# Patient Record
Sex: Male | Born: 1940 | Race: White | Hispanic: No | Marital: Married | Smoking: Never smoker
Health system: Southern US, Community
[De-identification: ages and names within clinical notes are randomized; demographics above are authoritative.]

## PROBLEM LIST (undated history)

## (undated) DIAGNOSIS — D649 Anemia, unspecified: Secondary | ICD-10-CM

## (undated) DIAGNOSIS — E119 Type 2 diabetes mellitus without complications: Secondary | ICD-10-CM

## (undated) DIAGNOSIS — I2510 Atherosclerotic heart disease of native coronary artery without angina pectoris: Secondary | ICD-10-CM

## (undated) DIAGNOSIS — Z95810 Presence of automatic (implantable) cardiac defibrillator: Secondary | ICD-10-CM

## (undated) DIAGNOSIS — Z95811 Presence of heart assist device: Secondary | ICD-10-CM

## (undated) DIAGNOSIS — I480 Paroxysmal atrial fibrillation: Secondary | ICD-10-CM

## (undated) DIAGNOSIS — I472 Ventricular tachycardia, unspecified: Secondary | ICD-10-CM

## (undated) DIAGNOSIS — A419 Sepsis, unspecified organism: Secondary | ICD-10-CM

## (undated) DIAGNOSIS — Z9889 Other specified postprocedural states: Secondary | ICD-10-CM

## (undated) DIAGNOSIS — I5022 Chronic systolic (congestive) heart failure: Secondary | ICD-10-CM

## (undated) DIAGNOSIS — N184 Chronic kidney disease, stage 4 (severe): Secondary | ICD-10-CM

## (undated) DIAGNOSIS — Z8679 Personal history of other diseases of the circulatory system: Secondary | ICD-10-CM

## (undated) DIAGNOSIS — Z951 Presence of aortocoronary bypass graft: Secondary | ICD-10-CM

## (undated) DIAGNOSIS — I219 Acute myocardial infarction, unspecified: Secondary | ICD-10-CM

## (undated) HISTORY — DX: Acute myocardial infarction, unspecified: I21.9

## (undated) HISTORY — DX: Personal history of other diseases of the circulatory system: Z86.79

## (undated) HISTORY — DX: Presence of automatic (implantable) cardiac defibrillator: Z95.810

## (undated) HISTORY — DX: Other specified postprocedural states: Z98.890

## (undated) HISTORY — DX: Anemia, unspecified: D64.9

## (undated) HISTORY — DX: Sepsis, unspecified organism: A41.9

## (undated) HISTORY — DX: Presence of aortocoronary bypass graft: Z95.1

---

## 1980-04-09 DIAGNOSIS — I219 Acute myocardial infarction, unspecified: Secondary | ICD-10-CM

## 1980-04-09 HISTORY — DX: Acute myocardial infarction, unspecified: I21.9

## 1999-07-11 DIAGNOSIS — Z951 Presence of aortocoronary bypass graft: Secondary | ICD-10-CM

## 1999-07-11 DIAGNOSIS — Z95810 Presence of automatic (implantable) cardiac defibrillator: Secondary | ICD-10-CM

## 1999-07-11 HISTORY — DX: Presence of automatic (implantable) cardiac defibrillator: Z95.810

## 1999-07-11 HISTORY — DX: Presence of aortocoronary bypass graft: Z95.1

## 2002-05-11 DIAGNOSIS — Z9889 Other specified postprocedural states: Secondary | ICD-10-CM

## 2002-05-11 DIAGNOSIS — Z8679 Personal history of other diseases of the circulatory system: Secondary | ICD-10-CM

## 2002-05-11 HISTORY — DX: Personal history of other diseases of the circulatory system: Z98.890

## 2002-05-11 HISTORY — DX: Personal history of other diseases of the circulatory system: Z86.79

## 2003-12-11 DIAGNOSIS — Z9289 Personal history of other medical treatment: Secondary | ICD-10-CM

## 2003-12-11 DIAGNOSIS — Z9889 Other specified postprocedural states: Secondary | ICD-10-CM

## 2003-12-11 HISTORY — DX: Other specified postprocedural states: Z98.890

## 2003-12-11 HISTORY — DX: Personal history of other medical treatment: Z92.89

## 2007-05-11 DIAGNOSIS — A419 Sepsis, unspecified organism: Secondary | ICD-10-CM

## 2007-05-11 DIAGNOSIS — B999 Unspecified infectious disease: Secondary | ICD-10-CM

## 2007-05-11 HISTORY — DX: Sepsis, unspecified organism: A41.9

## 2007-05-11 HISTORY — DX: Unspecified infectious disease: B99.9

## 2007-06-10 DIAGNOSIS — D649 Anemia, unspecified: Secondary | ICD-10-CM

## 2007-06-10 HISTORY — DX: Anemia, unspecified: D64.9

## 2010-05-29 HISTORY — PX: COLONOSCOPY: SHX174

## 2010-07-18 ENCOUNTER — Emergency Department (HOSPITAL_COMMUNITY)
Admission: EM | Admit: 2010-07-18 | Discharge: 2010-07-18 | Disposition: A | Discharge status: Home / Self Care | Payer: MEDICARE | Attending: Emergency Medicine | Admitting: Emergency Medicine

## 2010-07-18 DIAGNOSIS — Z79899 Other long term (current) drug therapy: Secondary | ICD-10-CM | POA: Insufficient documentation

## 2010-07-18 DIAGNOSIS — R197 Diarrhea, unspecified: Secondary | ICD-10-CM | POA: Insufficient documentation

## 2010-07-18 DIAGNOSIS — I509 Heart failure, unspecified: Secondary | ICD-10-CM | POA: Insufficient documentation

## 2010-07-18 DIAGNOSIS — I252 Old myocardial infarction: Secondary | ICD-10-CM | POA: Insufficient documentation

## 2010-07-18 DIAGNOSIS — Z950 Presence of cardiac pacemaker: Secondary | ICD-10-CM | POA: Insufficient documentation

## 2010-07-18 DIAGNOSIS — K625 Hemorrhage of anus and rectum: Secondary | ICD-10-CM | POA: Insufficient documentation

## 2010-07-18 DIAGNOSIS — Z7901 Long term (current) use of anticoagulants: Secondary | ICD-10-CM | POA: Insufficient documentation

## 2010-07-18 LAB — CBC
Hemoglobin: 14.3 g/dL (ref 13.0–17.0)
MCH: 30.2 pg (ref 26.0–34.0)
MCHC: 33.6 g/dL (ref 30.0–36.0)
MCV: 89.7 fL (ref 78.0–100.0)

## 2010-07-18 LAB — BASIC METABOLIC PANEL
Calcium: 8.6 mg/dL (ref 8.4–10.5)
Creatinine, Ser: 2.21 mg/dL — ABNORMAL HIGH (ref 0.4–1.5)
GFR calc non Af Amer: 30 mL/min — ABNORMAL LOW (ref >60)
Glucose, Bld: 121 mg/dL — ABNORMAL HIGH (ref 70–99)
Sodium: 138 mEq/L (ref 135–145)

## 2010-07-18 LAB — DIFFERENTIAL
Basophils Relative: 0 % (ref 0–1)
Eosinophils Absolute: 0.2 10*3/uL (ref 0.0–0.7)
Lymphs Abs: 0.9 10*3/uL (ref 0.7–4.0)
Monocytes Absolute: 0.7 10*3/uL (ref 0.1–1.0)
Monocytes Relative: 9 % (ref 3–12)

## 2010-07-18 LAB — PROTIME-INR: INR: 2.62 — ABNORMAL HIGH (ref 0.00–1.49)

## 2010-07-20 ENCOUNTER — Emergency Department (HOSPITAL_COMMUNITY)
Admission: EM | Admit: 2010-07-20 | Discharge: 2010-07-20 | Disposition: A | Discharge status: Home / Self Care | Payer: MEDICARE | Attending: Emergency Medicine | Admitting: Emergency Medicine

## 2010-07-20 DIAGNOSIS — R42 Dizziness and giddiness: Secondary | ICD-10-CM | POA: Insufficient documentation

## 2010-07-20 DIAGNOSIS — Z79899 Other long term (current) drug therapy: Secondary | ICD-10-CM | POA: Insufficient documentation

## 2010-07-20 DIAGNOSIS — Z950 Presence of cardiac pacemaker: Secondary | ICD-10-CM | POA: Insufficient documentation

## 2010-07-20 DIAGNOSIS — Z7982 Long term (current) use of aspirin: Secondary | ICD-10-CM | POA: Insufficient documentation

## 2010-07-20 DIAGNOSIS — K625 Hemorrhage of anus and rectum: Secondary | ICD-10-CM | POA: Insufficient documentation

## 2010-07-20 DIAGNOSIS — I252 Old myocardial infarction: Secondary | ICD-10-CM | POA: Insufficient documentation

## 2010-07-20 DIAGNOSIS — Z794 Long term (current) use of insulin: Secondary | ICD-10-CM | POA: Insufficient documentation

## 2010-07-20 DIAGNOSIS — T45515A Adverse effect of anticoagulants, initial encounter: Secondary | ICD-10-CM | POA: Insufficient documentation

## 2010-07-20 DIAGNOSIS — I509 Heart failure, unspecified: Secondary | ICD-10-CM | POA: Insufficient documentation

## 2010-07-20 DIAGNOSIS — D649 Anemia, unspecified: Secondary | ICD-10-CM | POA: Insufficient documentation

## 2010-07-20 DIAGNOSIS — Z7901 Long term (current) use of anticoagulants: Secondary | ICD-10-CM | POA: Insufficient documentation

## 2010-07-20 LAB — COMPREHENSIVE METABOLIC PANEL
AST: 18 U/L (ref 0–37)
BUN: 60 mg/dL — ABNORMAL HIGH (ref 6–23)
CO2: 26 mEq/L (ref 19–32)
Calcium: 7.8 mg/dL — ABNORMAL LOW (ref 8.4–10.5)
Chloride: 100 mEq/L (ref 96–112)
Creatinine, Ser: 2.23 mg/dL — ABNORMAL HIGH (ref 0.4–1.5)
GFR calc Af Amer: 35 mL/min — ABNORMAL LOW (ref >60)
GFR calc non Af Amer: 29 mL/min — ABNORMAL LOW (ref >60)
Glucose, Bld: 154 mg/dL — ABNORMAL HIGH (ref 70–99)
Total Bilirubin: 0.5 mg/dL (ref 0.3–1.2)

## 2010-07-20 LAB — CBC
HCT: 29.2 % — ABNORMAL LOW (ref 39.0–52.0)
Hemoglobin: 9.8 g/dL — ABNORMAL LOW (ref 13.0–17.0)
MCH: 29.6 pg (ref 26.0–34.0)
MCHC: 33.6 g/dL (ref 30.0–36.0)
MCV: 88.2 fL (ref 78.0–100.0)
RDW: 15.4 % (ref 11.5–15.5)

## 2010-07-20 LAB — DIFFERENTIAL
Basophils Absolute: 0 10*3/uL (ref 0.0–0.1)
Eosinophils Relative: 1 % (ref 0–5)
Lymphocytes Relative: 7 % — ABNORMAL LOW (ref 12–46)
Lymphs Abs: 0.6 10*3/uL — ABNORMAL LOW (ref 0.7–4.0)
Monocytes Absolute: 0.5 10*3/uL (ref 0.1–1.0)
Monocytes Relative: 7 % (ref 3–12)
Neutro Abs: 7 10*3/uL (ref 1.7–7.7)

## 2010-07-20 LAB — ABO/RH: ABO/RH(D): O NEG

## 2010-07-21 LAB — CROSSMATCH
ABO/RH(D): O NEG
Antibody Screen: NEGATIVE
Unit division: 0

## 2012-08-23 HISTORY — PX: DOBUTAMINE STRESS ECHO: SHX5426

## 2013-06-20 HISTORY — PX: HERNIA MESH REMOVAL: SHX1745

## 2013-09-04 ENCOUNTER — Ambulatory Visit (INDEPENDENT_AMBULATORY_CARE_PROVIDER_SITE_OTHER): Payer: MEDICARE | Admitting: Internal Medicine

## 2013-09-04 ENCOUNTER — Telehealth: Payer: Self-pay | Admitting: Family Medicine

## 2013-09-04 VITALS — BP 128/80 | HR 90 | Temp 98.3°F | Resp 18 | Ht 66.0 in | Wt 218.8 lb

## 2013-09-04 DIAGNOSIS — L989 Disorder of the skin and subcutaneous tissue, unspecified: Secondary | ICD-10-CM

## 2013-09-04 MED ORDER — MUPIROCIN 2 % EX OINT: 1.0000 "application " | TOPICAL_OINTMENT | Freq: Three times a day (TID) | CUTANEOUS | Status: DC

## 2013-09-04 MED ORDER — DOXYCYCLINE HYCLATE 100 MG PO TABS: 100.0000 mg | ORAL_TABLET | Freq: Two times a day (BID) | ORAL | Status: DC

## 2013-09-04 NOTE — Telephone Encounter (Signed)
Pharm called w/pt at pharm to ask about plan. I explained Dr Ernestene MentionGuest's instr's to take Doxy but call cardiologist to adjust warfarin dose. Pharm advised pt while I was on the phone and he is in agreement with the plan.

## 2013-09-04 NOTE — Patient Instructions (Signed)
Basal Cell Carcinoma Basal cell carcinoma is the most common form of skin cancer. It begins in the basal cells, which are at the bottom of the outer skin layer (epidermis). CAUSES  Sun exposure is the most common cause of basal cell carcinoma. Basal cell carcinoma occurs most often on parts of the body that are frequently exposed to the sun, including the:  Scalp.  Ears.  Neck.  Face.  Arms.  Backs of the hands.  Legs. However, basal cell carcinoma can occur anywhere on the body. Rarely, tumors develop on areas not exposed to the sun. Other causes of basal cell carcinoma can include:  Exposure to arsenic.  Exposure to radiation.  Certain genetic syndromes, such as xeroderma pigmentosum. RISK FACTORS People at highest risk for basal cell carcinoma include those with:  Fair skin.  Blonde or red hair.  Blue, green, or gray eyes.  Childhood freckling. Factors that increase your risk for basal cell carcinoma include:  Sun exposure over long periods of time. Childhood sun exposure appears to be a more significant factor than sun exposure as an adult.  Repeated sunburns.  Use of tanning beds.  Having a weakened immune system. SYMPTOMS Five signs of basal cell carcinoma are:  An open sore that bleeds, oozes, or crusts. The sore may remain open for 3 or more weeks. This can be an early sign of basal cell carcinoma. Basal cell carcinoma can mimic a pimple that will not heal.  A reddish or irritated area which may crust, itch, or cause discomfort. This may occur on areas expose d to the sun. These patches might be easier felt than seen.  A shiny, pearly, or translucent bump that is pink, red, or white. The bump may also be tan, black, or brown, especially in dark haired people. These bumps can be confused with moles.  A pink growth with a slightly elevated, rolled border, and a crusted indentation in the center. As the growth slowly enlarges, tiny blood vessels may develop  on the surface.  A scar-like white, yellow, or waxy area that looks like shiny, stretched skin. It often has irregular borders. This may be a sign of more aggressive basal cell carcinoma. DIAGNOSIS  Your caregiver may be able to tell what is wrong by doing a physical exam. Often, a tissue sample (biopsy) is also taken. The tissue is examined under a microscope.  TREATMENT  The treatment for basal cell carcinoma depends on the type, size, location, and number of tumors. Possible treatments include:   Mohs surgery. This is a procedure done by a skin doctor (dermatologist or Mohs surgeon) in his or her office. The cancerous cells are removed layer by layer. This treatment has a high cure rate.  Surgical removal of the tumor.  Freezing the tumor with liquid nitrogen (cryosurgery).  Plastic surgery to remove the tumor, in the case of large tumors.  Radiation. This may be used for tumors on the face.  Photodynamic therapy. A chemical cream is applied to the skin and light exposure is used to activate the chemical.  Chemical treatments, such as imiquimod cream and interferon injections. This may be used to remove superficial tumors with minimal scarring.  Electrodesiccation and curettage. This involves alternately scraping and burning the tumor, using an electric current to control bleeding. Basal cell carcinoma can almost always be cured. It rarely spreads to other areas of the body (metastasizes). Basal cell carcinoma may come back at the same location (recur), but it can be treated   again if this occurs. PREVENTION  Avoid the sun between 10:00 a.m. and 4:00 pm when it is the strongest.  Use a sunscreen or sunblock with a sun protection factor of 30 or greater.  Apply sunscreen at least 30 minutes before exposure to the sun.  Reapply sunscreen every 2 to 4 hours while you are outside, after swimming, and after excessive sweating.  Always wear protective hats, clothing, and sunglasses with  ultraviolet protection.  Avoid tanning beds. HOME CARE INSTRUCTIONS   Avoid unprotected sun exposure.  Follow your caregiver's instructions for self-exams. Look for new spots or changes in your skin.  Keep all follow-up appointments as directed by your caregiver. SEEK MEDICAL CARE IF:   You notice any new spots or changes in your skin.  You have had a basal cell carcinoma tumor removed and you notice a new growth in the same location. Document Released: 08/02/2002 Document Revised: 07/28/2011 Document Reviewed: 10/20/2010 ExitCare Patient Information 2015 [employer]. This information is not intended to replace advice given to you by your health care provider. Make sure you discuss any questions you have with your health care provider.  

## 2013-09-04 NOTE — Progress Notes (Signed)
   Subjective:    Patient ID: William Huerta, male    DOB: 1940-12-28, 73 y.o.   MRN: [id number]  HPI William AlcideRobert Lorincz is an 73 year old male that is being seen for boil on his left forearm.  Pt indicates boil has been on his forearm for about 4 weeks. OTC cream has been applied with little help. Pt states boil is sore and it is not itching or burning. There has been no discharge and he denies fever, chills, N/V, diarrhea/constipation and no injury that may have caused boil. Not painful.   Review of Systems     Objective:   Physical Exam  Constitutional: He is oriented to person, place, and time. He appears well-nourished. No distress.  HENT:  Head: Normocephalic.  Eyes: EOM are normal.  Neck: Normal range of motion.  Pulmonary/Chest: Effort normal.  Musculoskeletal: Normal range of motion.  Neurological: He is alert and oriented to person, place, and time. He exhibits normal muscle tone. Coordination normal.  Skin: Lesion and rash noted.     Ulcerated 1-2cm lesion left forearm.  Psychiatric: He has a normal mood and affect.          Assessment & Plan:  Probable Basal cell or squamous cell lesion See dermatology or surgeon to remove and analyze for cancer Doxycycline/mupirocin

## 2013-09-04 NOTE — Telephone Encounter (Signed)
LMOM for pt to call back He needs to call his cardiologist to have his coumadin readjusted while he is taking the doxy only.

## 2013-09-05 ENCOUNTER — Emergency Department (HOSPITAL_COMMUNITY)
Admission: EM | Admit: 2013-09-05 | Discharge: 2013-09-05 | Disposition: A | Discharge status: Home / Self Care | Payer: MEDICARE | Attending: Emergency Medicine | Admitting: Emergency Medicine

## 2013-09-05 ENCOUNTER — Encounter (HOSPITAL_COMMUNITY): Payer: Self-pay | Admitting: Emergency Medicine

## 2013-09-05 ENCOUNTER — Emergency Department (HOSPITAL_COMMUNITY): Payer: MEDICARE

## 2013-09-05 DIAGNOSIS — Z8619 Personal history of other infectious and parasitic diseases: Secondary | ICD-10-CM | POA: Diagnosis not present

## 2013-09-05 DIAGNOSIS — Z7901 Long term (current) use of anticoagulants: Secondary | ICD-10-CM | POA: Diagnosis not present

## 2013-09-05 DIAGNOSIS — Z95811 Presence of heart assist device: Secondary | ICD-10-CM

## 2013-09-05 DIAGNOSIS — D649 Anemia, unspecified: Secondary | ICD-10-CM | POA: Insufficient documentation

## 2013-09-05 DIAGNOSIS — Z9889 Other specified postprocedural states: Secondary | ICD-10-CM | POA: Diagnosis not present

## 2013-09-05 DIAGNOSIS — I509 Heart failure, unspecified: Secondary | ICD-10-CM | POA: Insufficient documentation

## 2013-09-05 DIAGNOSIS — R059 Cough, unspecified: Secondary | ICD-10-CM | POA: Insufficient documentation

## 2013-09-05 DIAGNOSIS — Z79899 Other long term (current) drug therapy: Secondary | ICD-10-CM | POA: Insufficient documentation

## 2013-09-05 DIAGNOSIS — I252 Old myocardial infarction: Secondary | ICD-10-CM | POA: Diagnosis not present

## 2013-09-05 DIAGNOSIS — Z794 Long term (current) use of insulin: Secondary | ICD-10-CM | POA: Diagnosis not present

## 2013-09-05 DIAGNOSIS — J159 Unspecified bacterial pneumonia: Secondary | ICD-10-CM | POA: Insufficient documentation

## 2013-09-05 DIAGNOSIS — R05 Cough: Secondary | ICD-10-CM | POA: Insufficient documentation

## 2013-09-05 DIAGNOSIS — J189 Pneumonia, unspecified organism: Secondary | ICD-10-CM

## 2013-09-05 DIAGNOSIS — R042 Hemoptysis: Secondary | ICD-10-CM | POA: Diagnosis not present

## 2013-09-05 DIAGNOSIS — Z95810 Presence of automatic (implantable) cardiac defibrillator: Secondary | ICD-10-CM | POA: Insufficient documentation

## 2013-09-05 DIAGNOSIS — I5022 Chronic systolic (congestive) heart failure: Secondary | ICD-10-CM

## 2013-09-05 LAB — BASIC METABOLIC PANEL
Anion gap: 13 (ref 5–15)
BUN: 26 mg/dL — ABNORMAL HIGH (ref 6–23)
CO2: 25 mEq/L (ref 19–32)
Calcium: 9.2 mg/dL (ref 8.4–10.5)
Chloride: 103 mEq/L (ref 96–112)
Creatinine, Ser: 1.66 mg/dL — ABNORMAL HIGH (ref 0.50–1.35)
GFR calc Af Amer: 45 mL/min — ABNORMAL LOW (ref >90)
GFR calc non Af Amer: 39 mL/min — ABNORMAL LOW (ref >90)
GLUCOSE: 123 mg/dL — AB (ref 70–99)
POTASSIUM: 4.7 meq/L (ref 3.7–5.3)
Sodium: 141 mEq/L (ref 137–147)

## 2013-09-05 LAB — I-STAT TROPONIN, ED: Troponin i, poc: 0 ng/mL (ref 0.00–0.08)

## 2013-09-05 LAB — PROTIME-INR
INR: 1.65 — ABNORMAL HIGH (ref 0.00–1.49)
Prothrombin Time: 19.5 seconds — ABNORMAL HIGH (ref 11.6–15.2)

## 2013-09-05 LAB — CBC
HCT: 41.7 % (ref 39.0–52.0)
HEMOGLOBIN: 13.1 g/dL (ref 13.0–17.0)
MCH: 29.1 pg (ref 26.0–34.0)
MCHC: 31.4 g/dL (ref 30.0–36.0)
MCV: 92.7 fL (ref 78.0–100.0)
Platelets: 129 10*3/uL — ABNORMAL LOW (ref 150–400)
RBC: 4.5 MIL/uL (ref 4.22–5.81)
RDW: 16 % — ABNORMAL HIGH (ref 11.5–15.5)
WBC: 11.2 10*3/uL — ABNORMAL HIGH (ref 4.0–10.5)

## 2013-09-05 LAB — LACTATE DEHYDROGENASE: LDH: 349 U/L — ABNORMAL HIGH (ref 94–250)

## 2013-09-05 LAB — PRO B NATRIURETIC PEPTIDE: PRO B NATRI PEPTIDE: 978.6 pg/mL — AB (ref 0–125)

## 2013-09-05 MED ORDER — DEXTROSE 5 % IV SOLN
500.0000 mg | Freq: Once | INTRAVENOUS | Status: DC
  Filled 2013-09-05: qty 500

## 2013-09-05 MED ORDER — VANCOMYCIN HCL 10 G IV SOLR
2000.0000 mg | Freq: Once | INTRAVENOUS | Status: DC
  Filled 2013-09-05: qty 2000

## 2013-09-05 MED ORDER — MOXIFLOXACIN HCL 400 MG PO TABS: 400.0000 mg | ORAL_TABLET | Freq: Every day | ORAL | Status: DC

## 2013-09-05 NOTE — Consult Note (Signed)
Primary Physician: Primary Cardiologist:  Dr. Horatio Pelolumbo 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 Presents to ER with productive cough and small volume hemoptysis.  Has been doing very well. 2 months ago had gallbladder out. Over past 2 days has had cough productive white/yellow sputum. + mild wheezing. No fevers or chills. Today coughed up small amount of bloody sputum x 1 prior to arrival. In ER sats 97% RA. WBC 11.2. Afebrile. CXR with small RUL PNA. INR 1.65. Denies orthopnea, PND. No problems with driveline site. No other bleeding.    Pt has HM II LVAD with EPC connected to batteries; back up equipment present. Left abdominal driveline with dressing dry with attachment device intact. Pt denies any heart failure symptoms; denies any blood in urine or stool or tea colored urine. Denies any fevers, chills, or drainage for drive line. Reviewed daily log with stable weights 205 - 207 lbs; no fevers, and VAD parameters stable. Pt denies any VAD alarms or equipment issues. MAP BP 88. VAD parameters: speed 8800, Flow 4.6, PI 5.5, Power 5.7 with 0 - 4 PI events and no alarms noted in history. Fixed speed 8800, low speed limit 8000.   Contacted VAD coordinator Minus Liberty(Rosie) at [facility] with patient status. Last clinic note from June 2015 faxed to our ED; contact info for Dr. Orpah MelterGaran and Dr. Horatio Pelolumbo obtained and all given to ED staff.  MAP and VAD parameters remain stable:  MAP 86  Speed: 8800  Flow: 5.6  PI: 5.2  Power: 5.5  [facility] office: [phone]  Fax: [phone]   Review of Systems: [y] = yes, [ ]  = no   General: Weight gain [ ] ; Weight loss [ ] ; Anorexia [ ] ; Fatigue [ ] ; Fever [ ] ; Chills [ ] ; Weakness [ ]   Cardiac: Chest pain/pressure [ ] ; Resting SOB [ ] ; Exertional SOB [ ] ; Orthopnea [ ] ; Pedal Edema [ ] ;  Palpitations [ ] ; Syncope [ ] ; Presyncope [ ] ; Paroxysmal nocturnal dyspnea[ ]   Pulmonary: Cough Cove.Etienne[y ]; [facility][y ]; Hemoptysis[y ]; Sputum Cove.Etienne[y ]; Snoring [ ]   GI: Vomiting[ ] ; Dysphagia[ ] ; Melena[ ] ; Hematochezia [ ] ; Heartburn[ ] ; Abdominal pain [ ] ; Constipation [ ] ; Diarrhea [ ] ; BRBPR [ ]   GU: Hematuria[ ] ; Dysuria [ ] ; Nocturia[ ]   Vascular: Pain in legs with walking [ ] ; Pain in feet with lying flat [ ] ; Non-healing sores [ ] ; Stroke [ ] ; TIA [ ] ; Slurred speech [ ] ;  Neuro: Headaches[ ] ; Vertigo[ ] ; Seizures[ ] ; Paresthesias[ ] ;Blurred vision [ ] ; Diplopia [ ] ; Vision changes [ ]   Ortho/Skin: Arthritis [ y]; Joint pain [ y]; Muscle pain [ ] ; Joint swelling [ ] ; Back Pain [ ] ; Rash [ ]   Psych: Depression[ ] ; Anxiety[ ]   Heme: Bleeding problems [ ] ; Clotting disorders [ ] ; Anemia [ ]   Endocrine: Diabetes Cove.Etienne[y ]; Thyroid dysfunction[ ]   Home Medications Prior to Admission medications   Medication Sig Start Date End Date Taking? Authorizing Provider  acetaminophen (TYLENOL) 500 MG tablet Take 500 mg by mouth every 6 (six) hours as needed for moderate pain.   Yes Historical Provider, MD  amiodarone (PACERONE) 200 MG tablet Take 200 mg by mouth daily.   Yes Historical Provider, MD  carvedilol (COREG) 3.125 MG tablet Take 3.125 mg by mouth 2 (two) times  daily with a meal.   Yes Historical Provider, MD  ferrous sulfate 325 (65 FE) MG tablet Take 325 mg by mouth 2 (two) times daily with a meal.    Yes Historical Provider, MD  furosemide (LASIX) 40 MG tablet Take 40 mg by mouth 2 (two) times daily.   Yes Historical Provider, MD  insulin NPH Human (HUMULIN N,NOVOLIN N) 100 UNIT/ML injection Inject 5 Units into the skin daily before breakfast.    Yes Historical Provider, MD  lisinopril (PRINIVIL,ZESTRIL) 5 MG tablet Take 5 mg by mouth every evening.    Yes Historical Provider, MD  Multiple Vitamin (MULTIVITAMIN) capsule Take 1 capsule by mouth daily.   Yes Historical Provider, MD  tamsulosin (FLOMAX)  0.4 MG CAPS capsule Take 0.4 mg by mouth daily after breakfast.    Yes Historical Provider, MD  warfarin (COUMADIN) 5 MG tablet Take 2.5-5 mg by mouth daily. Take 5 mg on Tues/Thurs and 2.5 mg for the rest of the week   Yes Historical Provider, MD  doxycycline (VIBRA-TABS) 100 MG tablet Take 1 tablet (100 mg total) by mouth 2 (two) times daily. 09/04/13   Jonita Albee, MD  moxifloxacin (AVELOX) 400 MG tablet Take 1 tablet (400 mg total) by mouth daily at 8 pm. 09/05/13   Shanna Cisco, MD    Past Medical History: Past Medical History  Diagnosis Date  . Heart attack 04/1980  . History of quadruple bypass 07/1999    Quad Bypass, mitral valve repair  . Presence of combination internal cardiac defibrillator (ICD) and pacemaker 07/1999    Guidant #103003  . S/P ablation of atrial fibrillation 05/2002  . History of cardioversion 12/2003  . Blood infection 05/2007  . Anemia 06/2007  . CHF (congestive heart failure) 05/2003, 06/2003, 11/2007    Past Surgical History: Past Surgical History  Procedure Laterality Date  . Colonoscopy  05/29/2010  . Dobutamine stress echo  08/23/2012  . Hernia mesh removal  06/20/13    Family History: Family History  Problem Relation Age of Onset  . Heart disease Sister   . Diabetes Brother   . Heart disease Brother   . Heart disease Brother     Social History: History   Social History  . Marital Status: Married    Spouse Name: N/A    Number of Children: N/A  . Years of Education: N/A   Social History Main Topics  . Smoking status: Never Smoker   . Smokeless tobacco: None  . Alcohol Use: No  . Drug Use: No  . Sexual Activity: None   Other Topics Concern  . None   Social History Narrative  . None    Allergies:  No Known Allergies  Objective:    Vital Signs:   Temp:  [98.5 F (36.9 C)] 98.5 F (36.9 C) (07/28 1438) Pulse Rate:  [85-91] 91 (07/28 1802) Resp:  [19-20] 19 (07/28 1802) BP: (108)/(74) 108/74 mmHg (07/28 1438) SpO2:   [95 %-100 %] 100 % (07/28 1802) Weight:  [98.884 kg (218 lb)] 98.884 kg (218 lb) (07/28 1802)   Filed Weights   09/05/13 1802  Weight: 98.884 kg (218 lb)    Mean arterial Pressure 86-88 Sat 93-97% RA  Physical Exam: General:  Well appearing. No resp difficulty HEENT: normal Neck: supple. JVP 7 . Carotids 2+ bilat; no bruits. No lymphadenopathy or thryomegaly appreciated. Cor: Mechanical heart sounds with LVAD hum present. Lungs: clear. No wheeze Abdomen: obese soft, nontender, nondistended. No hepatosplenomegaly. No bruits  or masses. Good bowel sounds. Driveline: C/D/I; securement device intact and driveline incorporated Extremities: no cyanosis, clubbing, rash, edema Neuro: alert & orientedx3, cranial nerves grossly intact. moves all 4 extremities w/o difficulty. Affect pleasant   Labs: Basic Metabolic Panel:  Recent Labs Lab 09/05/13 1529  NA 141  K 4.7  CL 103  CO2 25  GLUCOSE 123*  BUN 26*  CREATININE 1.66*  CALCIUM 9.2    Liver Function Tests: No results found for this basename: AST, ALT, ALKPHOS, BILITOT, PROT, ALBUMIN,  in the last 168 hours No results found for this basename: LIPASE, AMYLASE,  in the last 168 hours No results found for this basename: AMMONIA,  in the last 168 hours  CBC:  Recent Labs Lab 09/05/13 1529  WBC 11.2*  HGB 13.1  HCT 41.7  MCV 92.7  PLT 129*    Cardiac Enzymes: No results found for this basename: CKTOTAL, CKMB, CKMBINDEX, TROPONINI,  in the last 168 hours  BNP: BNP (last 3 results)  Recent Labs  09/05/13 1526  PROBNP 978.6*    CBG: No results found for this basename: GLUCAP,  in the last 168 hours  Coagulation Studies:  Recent Labs  09/05/13 1538  LABPROT 19.5*  INR 1.65*    Other results:   Imaging: Dg Chest 2 View  09/05/2013   CLINICAL DATA:  LEFT ventricular assist device for 5 years, recently coughing up phlegm, wheezing, hemoptysis, shortness of breath, history hypertension, diabetes, CHF,  coronary artery disease post CABG  EXAM: CHEST  2 VIEW  COMPARISON:  None  FINDINGS: LEFT ventricular assist device present.  LEFT subclavian AICD leads project at RIGHT atrium, RIGHT ventricle and coronary sinus.  Enlargement of cardiac silhouette post median sternotomy.  Atherosclerotic calcification aorta.  Focal perihilar infiltrate in RIGHT upper lobe question pneumonia.  Remaining lungs grossly clear.  No pleural effusion or pneumothorax.  Bones appear mildly demineralized.  IMPRESSION: Enlargement of cardiac silhouette post median sternotomy, AICD, and LVAD placement.  Suspected RIGHT upper lobe perihilar pneumonia.   Electronically Signed   By: Ulyses Southward M.D.   On: 09/05/2013 17:17         Assessment:  1. RUL PNA 2. Chronic systolic HF s/p VAD 3. CAD  4. Mild hemoptysis   Plan/Discussion:    He has small RUL PNA. No O2 requirement and hemodynamically stable. Will start moxifloxacin 400 mg daily x 7days and d/c home. Will not increase coumadin as INR likely to come up with abx. See in VAD Clinic Friday. i gave him Clinic number to call if problems. D/w Dr. Ellin Goodie by phone who is comfortable with plan.    I reviewed the LVAD parameters from today, and compared the results to the patient's prior recorded data.  No programming changes were made.  The LVAD is functioning within specified parameters.  The patient performs LVAD self-test daily.  LVAD interrogation was negative for any significant power changes, alarms or PI events/speed drops.  LVAD equipment check completed and is in good working order.  Back-up equipment present.   LVAD education done on emergency procedures and precautions and reviewed exit site care.  Length of Stay: 0  Holly Bodily 09/05/2013, 6:57 PM  VAD Team Pager [phone] (7am - 7am) +++VAD ISSUES ONLY+++ Advanced Heart Failure Team Pager [phone] (M-F; 7a - 4p)  Please contact [facility] for night-coverage after hours (4p -7a ) and weekends on  [email or web address] for all non- LVAD Issues

## 2013-09-05 NOTE — ED Notes (Signed)
Patient returned from xray.

## 2013-09-05 NOTE — ED Provider Notes (Signed)
CSN: [id number]     Arrival date & time 09/05/13  1434 History   First MD Initiated Contact with Patient 09/05/13 1528     Chief Complaint  Patient presents with  . Cough  . Wheezing     (Consider location/radiation/quality/duration/timing/severity/associated sxs/prior Treatment) Patient is a 73 y.o. male presenting with cough.  Cough Cough characteristics:  Productive Severity:  Moderate Onset quality:  Gradual Timing:  Constant Progression:  Worsening Chronicity:  New Associated symptoms: no chest pain, no headaches and no shortness of breath      Past Medical History  Diagnosis Date  . Heart attack 04/1980  . History of quadruple bypass 07/1999    Quad Bypass, mitral valve repair  . Presence of combination internal cardiac defibrillator (ICD) and pacemaker 07/1999    Guidant #103003  . S/P ablation of atrial fibrillation 05/2002  . History of cardioversion 12/2003  . Blood infection 05/2007  . Anemia 06/2007  . CHF (congestive heart failure) 05/2003, 06/2003, 11/2007   Past Surgical History  Procedure Laterality Date  . Colonoscopy  05/29/2010  . Dobutamine stress echo  08/23/2012  . Hernia mesh removal  06/20/13   Family History  Problem Relation Age of Onset  . Heart disease Sister   . Diabetes Brother   . Heart disease Brother   . Heart disease Brother    History  Substance Use Topics  . Smoking status: Never Smoker   . Smokeless tobacco: Not on file  . Alcohol Use: No    Review of Systems  Constitutional: Negative for activity change.  HENT: Negative for congestion.   Eyes: Negative for visual disturbance.  Respiratory: Positive for cough. Negative for shortness of breath.        Blood in sputum   Cardiovascular: Negative for chest pain and leg swelling.  Gastrointestinal: Negative for abdominal pain and blood in stool.  Genitourinary: Negative for dysuria and hematuria.  Musculoskeletal: Negative for back pain.  Skin: Negative for color change.   Neurological: Negative for syncope and headaches.  Psychiatric/Behavioral: Negative for agitation.      Allergies  Review of patient's allergies indicates no known allergies.  Home Medications   Prior to Admission medications   Medication Sig Start Date End Date Taking? Authorizing Provider  amiodarone (PACERONE) 200 MG tablet Take 200 mg by mouth daily.    Historical Provider, MD  carvedilol (COREG) 3.125 MG tablet Take 3.125 mg by mouth 2 (two) times daily with a meal.    Historical Provider, MD  doxycycline (VIBRA-TABS) 100 MG tablet Take 1 tablet (100 mg total) by mouth 2 (two) times daily. 09/04/13   Jonita Albeehris W Guest, MD  ferrous sulfate 325 (65 FE) MG tablet Take 325 mg by mouth daily with breakfast.    Historical Provider, MD  furosemide (LASIX) 40 MG tablet Take 40 mg by mouth 2 (two) times daily.    Historical Provider, MD  insulin NPH Human (HUMULIN N,NOVOLIN N) 100 UNIT/ML injection Inject 5 Units into the skin.    Historical Provider, MD  lisinopril (PRINIVIL,ZESTRIL) 5 MG tablet Take 5 mg by mouth daily.    Historical Provider, MD  Multiple Vitamin (MULTIVITAMIN) capsule Take 1 capsule by mouth daily.    Historical Provider, MD  mupirocin ointment (BACTROBAN) 2 % Apply 1 application topically 3 (three) times daily. 09/04/13   Jonita Albeehris W Guest, MD  tamsulosin (FLOMAX) 0.4 MG CAPS capsule Take 0.4 mg by mouth.    Historical Provider, MD  warfarin (COUMADIN) 5 MG  tablet Take 5 mg by mouth daily. Take 5 mg on Tues/Thurs and 2.5 mg for the rest of the week    Historical Provider, MD   BP 108/74  Pulse 85  Temp(Src) 98.5 F (36.9 C) (Oral)  Resp 20  SpO2 95% Physical Exam  Nursing note and vitals reviewed. Constitutional: He is oriented to person, place, and time. He appears well-developed and well-nourished.  HENT:  Head: Normocephalic.  Eyes: Pupils are equal, round, and reactive to light.  Neck: Neck supple.  Cardiovascular:  audible hum of LVAD  Distal extremities appear  well perfused  Pulmonary/Chest: Effort normal. No respiratory distress.  Abdominal: Soft. He exhibits no distension. There is no tenderness.  Musculoskeletal: He exhibits no edema.  Neurological: He is alert and oriented to person, place, and time.  Skin: Skin is warm.  Psychiatric: He has a normal mood and affect.    ED Course  Procedures (including critical care time) Labs Review Labs Reviewed  PRO B NATRIURETIC PEPTIDE - Abnormal; Notable for the following:    Pro B Natriuretic peptide (BNP) 978.6 (*)    All other components within normal limits  BASIC METABOLIC PANEL - Abnormal; Notable for the following:    Glucose, Bld 123 (*)    BUN 26 (*)    Creatinine, Ser 1.66 (*)    GFR calc non Af Amer 39 (*)    GFR calc Af Amer 45 (*)    All other components within normal limits  CBC - Abnormal; Notable for the following:    WBC 11.2 (*)    RDW 16.0 (*)    Platelets 129 (*)    All other components within normal limits  LACTATE DEHYDROGENASE - Abnormal; Notable for the following:    LDH 349 (*)    All other components within normal limits  PROTIME-INR  I-STAT TROPOININ, ED    Imaging Review Dg Chest 2 View  09/05/2013   CLINICAL DATA:  LEFT ventricular assist device for 5 years, recently coughing up phlegm, wheezing, hemoptysis, shortness of breath, history hypertension, diabetes, CHF, coronary artery disease post CABG  EXAM: CHEST  2 VIEW  COMPARISON:  None  FINDINGS: LEFT ventricular assist device present.  LEFT subclavian AICD leads project at RIGHT atrium, RIGHT ventricle and coronary sinus.  Enlargement of cardiac silhouette post median sternotomy.  Atherosclerotic calcification aorta.  Focal perihilar infiltrate in RIGHT upper lobe question pneumonia.  Remaining lungs grossly clear.  No pleural effusion or pneumothorax.  Bones appear mildly demineralized.  IMPRESSION: Enlargement of cardiac silhouette post median sternotomy, AICD, and LVAD placement.  Suspected RIGHT upper lobe  perihilar pneumonia.   Electronically Signed   By: Ulyses Southward M.D.   On: 09/05/2013 17:17     EKG Interpretation None      MDM   Final diagnoses:  CAP (community acquired pneumonia)   73 year old male with end-stage CHF with LVAD place presents with 3 days of cough. Patient states that he started having blood in his sputum prompting him to present to the emergency department. Patient denies fevers chills shortness of breath or chest pain. Patient also states that he thought he heard a usual sound from his LVAD machine. Patient is well-appearing on exam and sitting comfortably. Chest x-ray reveals pneumonia. Patient's cardiologist any work was contacted by our LVAD specialist. It was determined that the patient would be appropriate for discharge home with oral antibiotics for her pneumonia. Patient agrees to this discharge plan return precautions were given.  Clement Sayres, MD 09/06/13 2124

## 2013-09-05 NOTE — ED Notes (Signed)
Patient to xray.

## 2013-09-05 NOTE — Discharge Instructions (Signed)

## 2013-09-05 NOTE — Progress Notes (Signed)
VAD coordinator paged to ED for arrival of HM II LVAD patient. Pt implanted in [place] and has second home here in [place].  Pt awake, alert; presents to ED with c/o dizziness, lightheadedness, productive cough of white/yellow sputum. Coughed bloody sputum x 1 prior to arrival.  Also c/o "wheezing" in chest. Pt has HM II LVAD with EPC connected to batteries; back up equipment present. Left abdominal driveline with dressing dry with attachment device intact. Pt denies any heart failure symptoms; denies any blood in urine or stool or tea colored urine. Denies any fevers, chills, or drainage for drive line.  Reviewed daily log with stable weights 205 - 207 lbs; no fevers, and VAD parameters stable. Pt denies any VAD alarms or equipment issues.  MAP BP 88. VAD parameters: speed 8800, Flow 4.6, PI 5.5, Power 5.7 with 0 - 4 PI events and no alarms noted in history. Fixed speed 8800, low speed limit 8000.  Contacted VAD coordinator Minus Liberty(Rosie) at [facility] with patient status.  Last clinic note from June 2015 faxed to our ED; contact info for Dr. Orpah MelterGaran and Dr. Horatio Pelolumbo obtained and all given to ED staff.  Dr. Gala RomneyBensimhon notified of patient arrival to our ED.   MAP and VAD parameters remain stable: MAP 86 Speed:  8800 Flow:  5.6 PI:  5.2 Power:  5.5  [facility] V[facility] office:  [phone] Fax:  [phone]

## 2013-09-05 NOTE — ED Notes (Signed)
Kirt BoysMolly, RN, VAD coordinator at bedside with patient. Patient is on VAD monitor applied by [facility].

## 2013-09-05 NOTE — ED Notes (Addendum)
Per pt sts productive cough and wheezing that started today. Denies chest pain. Pt is an LVAD pt from [place].  .Marland Kitchen

## 2013-09-05 NOTE — ED Notes (Signed)
Dr. Lorenda Cahillan Bensimone at bedside discharging patient home.

## 2013-09-07 ENCOUNTER — Other Ambulatory Visit: Payer: Self-pay | Admitting: Internal Medicine

## 2013-09-07 NOTE — ED Provider Notes (Signed)
Medical screening examination/treatment/procedure(s) were conducted as a shared visit with non-physician practitioner(s) and myself.  I personally evaluated the patient during the encounter.   EKG Interpretation   Date/Time:  Tuesday September 05 2013 15:08:09 EDT Ventricular Rate:  90 PR Interval:    QRS Duration: 144 QT Interval:  386 QTC Calculation: 472 R Axis:   -124 Text Interpretation:  Accelerated junctional rhythm Nonspecific  intraventricular conduction delay Anterolateral infarct, age indeterminate  Artifact in lead(s) I II III aVR aVL aVF V1 V2 V3 V4 V5 V6 ED PHYSICIAN  INTERPRETATION AVAILABLE IN [facility] Confirmed by TEST, Record  ([id number]) on 09/07/2013 7:46:19 AM      Pt presents w/ productive cough, one episode of blood streaked sputum. On PE he is nontoxic appearing. LVAD supportive team present shortly after his arrival. CXR w/ developing pna, Dr. Seward SpeckBenisomone has seen pt and also has spoken w/ his home cardiologist. They agree upon outpt treatment of pna. Return precautions given for new or worsening symptoms including worsening cough, SOB, feve,r CP.    Shanna CiscoMegan E Becca Bayne, MD 09/07/13 [phone]

## 2013-09-08 ENCOUNTER — Ambulatory Visit (HOSPITAL_COMMUNITY)
Admission: RE | Admit: 2013-09-08 | Discharge: 2013-09-08 | Disposition: A | Discharge status: Home / Self Care | Payer: MEDICARE | Source: Ambulatory Visit | Attending: Internal Medicine | Admitting: Internal Medicine

## 2013-09-08 ENCOUNTER — Other Ambulatory Visit (HOSPITAL_COMMUNITY): Payer: Self-pay | Admitting: *Deleted

## 2013-09-08 DIAGNOSIS — I5022 Chronic systolic (congestive) heart failure: Secondary | ICD-10-CM

## 2013-09-08 DIAGNOSIS — Z95811 Presence of heart assist device: Secondary | ICD-10-CM | POA: Diagnosis present

## 2013-09-08 DIAGNOSIS — Z7901 Long term (current) use of anticoagulants: Secondary | ICD-10-CM | POA: Diagnosis not present

## 2013-09-08 LAB — BASIC METABOLIC PANEL
Anion gap: 13 (ref 5–15)
BUN: 27 mg/dL — ABNORMAL HIGH (ref 6–23)
CO2: 24 mEq/L (ref 19–32)
Calcium: 8.5 mg/dL (ref 8.4–10.5)
Chloride: 105 mEq/L (ref 96–112)
Creatinine, Ser: 1.87 mg/dL — ABNORMAL HIGH (ref 0.50–1.35)
GFR calc Af Amer: 39 mL/min — ABNORMAL LOW (ref >90)
GFR calc non Af Amer: 34 mL/min — ABNORMAL LOW (ref >90)
GLUCOSE: 135 mg/dL — AB (ref 70–99)
POTASSIUM: 4.4 meq/L (ref 3.7–5.3)
Sodium: 142 mEq/L (ref 137–147)

## 2013-09-08 LAB — CBC
HCT: 42.2 % (ref 39.0–52.0)
HEMOGLOBIN: 13.5 g/dL (ref 13.0–17.0)
MCH: 29.5 pg (ref 26.0–34.0)
MCHC: 32 g/dL (ref 30.0–36.0)
MCV: 92.1 fL (ref 78.0–100.0)
Platelets: 143 10*3/uL — ABNORMAL LOW (ref 150–400)
RBC: 4.58 MIL/uL (ref 4.22–5.81)
RDW: 15.9 % — ABNORMAL HIGH (ref 11.5–15.5)
WBC: 8.2 10*3/uL (ref 4.0–10.5)

## 2013-09-08 LAB — LACTATE DEHYDROGENASE: LDH: 333 U/L — AB (ref 94–250)

## 2013-09-08 LAB — PROTIME-INR
INR: 2.12 — AB (ref 0.00–1.49)
PROTHROMBIN TIME: 23.7 s — AB (ref 11.6–15.2)

## 2013-09-08 NOTE — Progress Notes (Signed)
Pt presents for f/u from recent ED visit on 09/05/13.  Pt was started on Avelox for RUL pneumonia. ED visit note along with labs have been faxed to [facility]. Pt asked that we include Shanna CiscoSheri Lee, NP at his [facility]. Her contact info:  [phone]  Vital signs: HR:  103 MAP BP:  96 O2 Sat:  92; 93 Wt:  216.4 lbs  LVAD interrogation reveals:  Speed:  8800 Flow:  4.7 Power:  6.0 PI:  5.0 Alarms:  Few low voltage advisories Events:  2 - 5 PI events Fixed speed:  Low speed limit:   Pt/caregiver deny any alarms or VAD equipment issues. VAD coordinator reviewed daily log from home for daily temperature, weight, and VAD parameters.  LVAD equipment check completed and is in good working order. Back-up equipment present. LVAD education done on emergency procedures and precautions and reviewed exit site care.   Pt states he feels better overall; no further cough; denies fevers or chills. Will send lab results to his VAD center and [facility].   Emergency contact info as well as clinic contact information given to pt/wife. Instructed them to call if any issues or concerns. Both agreed to same.

## 2013-09-11 ENCOUNTER — Telehealth (HOSPITAL_COMMUNITY): Payer: Self-pay | Admitting: *Deleted

## 2013-09-11 ENCOUNTER — Other Ambulatory Visit: Payer: Self-pay | Admitting: Internal Medicine

## 2013-09-11 NOTE — Telephone Encounter (Signed)
Pt called to report he continues to feel "tired, no energy"; feeling weak - stood up this morning and "had to sit back down"; legs "shaking". States he thought he would feel better by now after being on antibiotic for tx of pneumonia and is concerned he is still so weak.  Pt denies fevers, chills, cough; reviewed VAD parameters from this am:  Flow 4.5, Speed 8800, Power 5.0, PI 4.5; denies any VAD alarms or heart failure symptoms. Pt doesn't feel bad enough to come to ED at this time. Dr. Gala RomneyBensimhon updated - instructed wife and patient to hold Lasix for 2 days; if patient not feeling better by Wednesday, he is to call and come in for clinic appt. If symptoms worsen or do not improve, instructed pt to come to ED. Both pt and wife verbalize understanding of same.

## 2014-08-23 ENCOUNTER — Inpatient Hospital Stay (HOSPITAL_COMMUNITY)
Admission: EM | Admit: 2014-08-23 | Discharge: 2014-08-25 | DRG: 309 | Disposition: A | Discharge status: Home / Self Care | Payer: Medicare Other | Attending: Cardiology | Admitting: Cardiology

## 2014-08-23 ENCOUNTER — Telehealth (HOSPITAL_COMMUNITY): Payer: Self-pay | Admitting: Cardiology

## 2014-08-23 ENCOUNTER — Emergency Department (HOSPITAL_COMMUNITY): Payer: Medicare Other

## 2014-08-23 ENCOUNTER — Encounter (HOSPITAL_COMMUNITY): Payer: Self-pay | Admitting: Emergency Medicine

## 2014-08-23 DIAGNOSIS — I252 Old myocardial infarction: Secondary | ICD-10-CM

## 2014-08-23 DIAGNOSIS — Z8249 Family history of ischemic heart disease and other diseases of the circulatory system: Secondary | ICD-10-CM | POA: Diagnosis not present

## 2014-08-23 DIAGNOSIS — Z833 Family history of diabetes mellitus: Secondary | ICD-10-CM | POA: Diagnosis not present

## 2014-08-23 DIAGNOSIS — Z7901 Long term (current) use of anticoagulants: Secondary | ICD-10-CM | POA: Diagnosis not present

## 2014-08-23 DIAGNOSIS — D649 Anemia, unspecified: Secondary | ICD-10-CM | POA: Diagnosis present

## 2014-08-23 DIAGNOSIS — R000 Tachycardia, unspecified: Secondary | ICD-10-CM

## 2014-08-23 DIAGNOSIS — I2510 Atherosclerotic heart disease of native coronary artery without angina pectoris: Secondary | ICD-10-CM | POA: Diagnosis present

## 2014-08-23 DIAGNOSIS — Z95811 Presence of heart assist device: Secondary | ICD-10-CM | POA: Diagnosis not present

## 2014-08-23 DIAGNOSIS — I5022 Chronic systolic (congestive) heart failure: Secondary | ICD-10-CM | POA: Diagnosis present

## 2014-08-23 DIAGNOSIS — Z79899 Other long term (current) drug therapy: Secondary | ICD-10-CM | POA: Diagnosis not present

## 2014-08-23 DIAGNOSIS — I480 Paroxysmal atrial fibrillation: Secondary | ICD-10-CM | POA: Diagnosis present

## 2014-08-23 DIAGNOSIS — E1122 Type 2 diabetes mellitus with diabetic chronic kidney disease: Secondary | ICD-10-CM | POA: Diagnosis present

## 2014-08-23 DIAGNOSIS — I129 Hypertensive chronic kidney disease with stage 1 through stage 4 chronic kidney disease, or unspecified chronic kidney disease: Secondary | ICD-10-CM | POA: Diagnosis present

## 2014-08-23 DIAGNOSIS — Z951 Presence of aortocoronary bypass graft: Secondary | ICD-10-CM

## 2014-08-23 DIAGNOSIS — I255 Ischemic cardiomyopathy: Secondary | ICD-10-CM | POA: Diagnosis present

## 2014-08-23 DIAGNOSIS — I472 Ventricular tachycardia, unspecified: Secondary | ICD-10-CM

## 2014-08-23 DIAGNOSIS — Z794 Long term (current) use of insulin: Secondary | ICD-10-CM

## 2014-08-23 DIAGNOSIS — Z95810 Presence of automatic (implantable) cardiac defibrillator: Secondary | ICD-10-CM | POA: Diagnosis not present

## 2014-08-23 DIAGNOSIS — I481 Persistent atrial fibrillation: Secondary | ICD-10-CM | POA: Diagnosis not present

## 2014-08-23 DIAGNOSIS — R531 Weakness: Secondary | ICD-10-CM

## 2014-08-23 DIAGNOSIS — N184 Chronic kidney disease, stage 4 (severe): Secondary | ICD-10-CM | POA: Diagnosis present

## 2014-08-23 HISTORY — DX: Chronic systolic (congestive) heart failure: I50.22

## 2014-08-23 HISTORY — DX: Atherosclerotic heart disease of native coronary artery without angina pectoris: I25.10

## 2014-08-23 HISTORY — DX: Presence of heart assist device: Z95.811

## 2014-08-23 HISTORY — DX: Chronic kidney disease, stage 4 (severe): N18.4

## 2014-08-23 HISTORY — DX: Presence of automatic (implantable) cardiac defibrillator: Z95.810

## 2014-08-23 HISTORY — DX: Ventricular tachycardia: I47.2

## 2014-08-23 HISTORY — DX: Paroxysmal atrial fibrillation: I48.0

## 2014-08-23 HISTORY — DX: Type 2 diabetes mellitus without complications: E11.9

## 2014-08-23 HISTORY — DX: Ventricular tachycardia, unspecified: I47.20

## 2014-08-23 LAB — CBC WITH DIFFERENTIAL/PLATELET
Basophils Absolute: 0.1 10*3/uL (ref 0.0–0.1)
Basophils Relative: 1 % (ref 0–1)
EOS ABS: 0.2 10*3/uL (ref 0.0–0.7)
Eosinophils Relative: 2 % (ref 0–5)
HCT: 44.2 % (ref 39.0–52.0)
HEMOGLOBIN: 14.1 g/dL (ref 13.0–17.0)
Lymphocytes Relative: 7 % — ABNORMAL LOW (ref 12–46)
Lymphs Abs: 0.6 10*3/uL — ABNORMAL LOW (ref 0.7–4.0)
MCH: 29.3 pg (ref 26.0–34.0)
MCHC: 31.9 g/dL (ref 30.0–36.0)
MCV: 91.7 fL (ref 78.0–100.0)
MONOS PCT: 8 % (ref 3–12)
Monocytes Absolute: 0.7 10*3/uL (ref 0.1–1.0)
NEUTROS ABS: 6.8 10*3/uL (ref 1.7–7.7)
Neutrophils Relative %: 82 % — ABNORMAL HIGH (ref 43–77)
PLATELETS: 120 10*3/uL — AB (ref 150–400)
RBC: 4.82 MIL/uL (ref 4.22–5.81)
RDW: 18 % — ABNORMAL HIGH (ref 11.5–15.5)
WBC: 8.3 10*3/uL (ref 4.0–10.5)

## 2014-08-23 LAB — MRSA PCR SCREENING: MRSA BY PCR: NEGATIVE

## 2014-08-23 LAB — LACTATE DEHYDROGENASE: LDH: 369 U/L — ABNORMAL HIGH (ref 98–192)

## 2014-08-23 LAB — COMPREHENSIVE METABOLIC PANEL
ALK PHOS: 95 U/L (ref 38–126)
ALT: 17 U/L (ref 17–63)
ANION GAP: 9 (ref 5–15)
AST: 30 U/L (ref 15–41)
Albumin: 3.5 g/dL (ref 3.5–5.0)
BILIRUBIN TOTAL: 1.8 mg/dL — AB (ref 0.3–1.2)
BUN: 32 mg/dL — ABNORMAL HIGH (ref 6–20)
CHLORIDE: 109 mmol/L (ref 101–111)
CO2: 23 mmol/L (ref 22–32)
Calcium: 8.6 mg/dL — ABNORMAL LOW (ref 8.9–10.3)
Creatinine, Ser: 2.18 mg/dL — ABNORMAL HIGH (ref 0.61–1.24)
GFR calc non Af Amer: 28 mL/min — ABNORMAL LOW (ref >60)
GFR, EST AFRICAN AMERICAN: 32 mL/min — AB (ref >60)
Glucose, Bld: 152 mg/dL — ABNORMAL HIGH (ref 65–99)
Potassium: 4.2 mmol/L (ref 3.5–5.1)
Sodium: 141 mmol/L (ref 135–145)
Total Protein: 6.3 g/dL — ABNORMAL LOW (ref 6.5–8.1)

## 2014-08-23 LAB — PROTIME-INR
INR: 3.17 — AB (ref 0.00–1.49)
Prothrombin Time: 31.9 seconds — ABNORMAL HIGH (ref 11.6–15.2)

## 2014-08-23 LAB — MAGNESIUM: Magnesium: 2.3 mg/dL (ref 1.7–2.4)

## 2014-08-23 MED ORDER — AMIODARONE HCL IN DEXTROSE 360-4.14 MG/200ML-% IV SOLN
30.0000 mg/h | INTRAVENOUS | Status: DC
  Administered 2014-08-23 – 2014-08-24 (×3): 30 mg/h via INTRAVENOUS
  Filled 2014-08-23 (×6): qty 200

## 2014-08-23 MED ORDER — FUROSEMIDE 80 MG PO TABS
80.0000 mg | ORAL_TABLET | Freq: Every day | ORAL | Status: DC
  Filled 2014-08-23: qty 1

## 2014-08-23 MED ORDER — INSULIN NPH (HUMAN) (ISOPHANE) 100 UNIT/ML ~~LOC~~ SUSP
5.0000 [IU] | Freq: Every day | SUBCUTANEOUS | Status: DC
  Administered 2014-08-24 – 2014-08-25 (×2): 5 [IU] via SUBCUTANEOUS
  Filled 2014-08-23: qty 10

## 2014-08-23 MED ORDER — AMIODARONE HCL IN DEXTROSE 360-4.14 MG/200ML-% IV SOLN
60.0000 mg/h | Freq: Once | INTRAVENOUS | Status: AC
  Administered 2014-08-23: 60 mg/h via INTRAVENOUS
  Filled 2014-08-23: qty 200

## 2014-08-23 MED ORDER — LISINOPRIL 5 MG PO TABS
5.0000 mg | ORAL_TABLET | Freq: Every evening | ORAL | Status: DC
Start: 2014-08-23 — End: 2014-08-23
  Filled 2014-08-23: qty 1

## 2014-08-23 MED ORDER — SODIUM CHLORIDE 0.9 % IJ SOLN: 3.0000 mL | INTRAMUSCULAR | Status: DC | PRN

## 2014-08-23 MED ORDER — ACETAMINOPHEN 325 MG PO TABS: 650.0000 mg | ORAL_TABLET | ORAL | Status: DC | PRN

## 2014-08-23 MED ORDER — TAMSULOSIN HCL 0.4 MG PO CAPS
0.4000 mg | ORAL_CAPSULE | Freq: Every day | ORAL | Status: DC
  Administered 2014-08-24 – 2014-08-25 (×2): 0.4 mg via ORAL
  Filled 2014-08-23 (×3): qty 1

## 2014-08-23 MED ORDER — SODIUM CHLORIDE 0.9 % IV SOLN: 250.0000 mL | INTRAVENOUS | Status: DC

## 2014-08-23 MED ORDER — FERROUS SULFATE 325 (65 FE) MG PO TABS
325.0000 mg | ORAL_TABLET | Freq: Two times a day (BID) | ORAL | Status: DC
  Administered 2014-08-23 – 2014-08-25 (×3): 325 mg via ORAL
  Filled 2014-08-23 (×6): qty 1

## 2014-08-23 MED ORDER — FUROSEMIDE 40 MG PO TABS
40.0000 mg | ORAL_TABLET | Freq: Two times a day (BID) | ORAL | Status: DC
  Administered 2014-08-23 – 2014-08-25 (×4): 40 mg via ORAL
  Filled 2014-08-23 (×5): qty 1

## 2014-08-23 MED ORDER — CARVEDILOL 3.125 MG PO TABS
3.1250 mg | ORAL_TABLET | Freq: Two times a day (BID) | ORAL | Status: DC
  Administered 2014-08-23 – 2014-08-25 (×4): 3.125 mg via ORAL
  Filled 2014-08-23 (×6): qty 1

## 2014-08-23 MED ORDER — AMIODARONE IV BOLUS ONLY 150 MG/100ML: 150.0000 mg | Freq: Once | INTRAVENOUS | Status: DC

## 2014-08-23 MED ORDER — AMIODARONE LOAD VIA INFUSION
150.0000 mg | Freq: Once | INTRAVENOUS | Status: AC
  Administered 2014-08-23: 150 mg via INTRAVENOUS
  Filled 2014-08-23: qty 83.34

## 2014-08-23 MED ORDER — SODIUM CHLORIDE 0.9 % IJ SOLN: 3.0000 mL | Freq: Two times a day (BID) | INTRAMUSCULAR | Status: DC

## 2014-08-23 MED ORDER — ACETAMINOPHEN 650 MG RE SUPP: 650.0000 mg | RECTAL | Status: DC | PRN

## 2014-08-23 MED ORDER — AMIODARONE HCL IN DEXTROSE 360-4.14 MG/200ML-% IV SOLN
60.0000 mg/h | INTRAVENOUS | Status: AC
Start: 2014-08-23 — End: 2014-08-23
  Administered 2014-08-23: 60 mg/h via INTRAVENOUS
  Filled 2014-08-23: qty 200

## 2014-08-23 NOTE — ED Notes (Signed)
Attempted report x1. 

## 2014-08-23 NOTE — ED Notes (Signed)
Cardiology at bedside.

## 2014-08-23 NOTE — Consult Note (Signed)
Reason for Consult:wide QRS tachycardia  Referring Physician: Dr. Lajuana RippleMcLean  William Huerta is an 74 y.o. male.   HPI: The patient is a 74 yo man with a long past medical history notable for an ICM, s/p MI, s/p CABG MV repair, s/p LVAD due to longstanding heart failure. [place] lives in [place] but [place] in [place] and was in his usual state of health and presents after feeling badly for 1-2 days. He was found to be in a wide QRS tachycardia at 140/min. He notes that his INR's have been in the 3-3.5 range and is absolutely certain that the INR has not been less than 2. Today we have tried to pace him out of his VT. With ATP, his VT has slowed from 140/min down to 125/min. He denies anginal symptoms. His LVAD has been in place for over 6 years.  PMH: Past Medical History  Diagnosis Date  . Heart attack 04/1980  . History of quadruple bypass 07/1999    Quad Bypass, mitral valve repair  . Presence of combination internal cardiac defibrillator (ICD) and pacemaker 07/1999    Guidant #103003  . S/P ablation of atrial fibrillation 05/2002  . History of cardioversion 12/2003  . Blood infection 05/2007  . Anemia 06/2007  . CHF (congestive heart failure) 05/2003, 06/2003, 11/2007  . LVAD (left ventricular assist device) present     PSHX: Past Surgical History  Procedure Laterality Date  . Colonoscopy  05/29/2010  . Dobutamine stress echo  08/23/2012  . Hernia mesh removal  06/20/13    FAMHX: Family History  Problem Relation Age of Onset  . Heart disease Sister   . Diabetes Brother   . Heart disease Brother   . Heart disease Brother     Social History:  reports that he has never smoked. He does not have any smokeless tobacco history on file. He reports that he does not drink alcohol or use illicit drugs.  Allergies: No Known Allergies  Medications: I have reviewed the patient's current medications.  Dg Chest Portable 1 View  08/23/2014   CLINICAL DATA:  One month of productive cough.  One day of weakness and dizziness.  EXAM: PORTABLE CHEST - 1 VIEW  COMPARISON:  September 05, 2013  FINDINGS: There is airspace consolidation in the left base. Lungs elsewhere clear. Heart is mildly enlarged with pulmonary vascularity within normal limits. There is a left ventricular assist device present. Pacemaker leads are attached to the right atrium and right ventricle. No adenopathy.  IMPRESSION: Left lower lobe consolidation. Lungs elsewhere clear. Stable cardiac enlargement. Stable pacemaker lead positioning as well as left ventricular assist device present. Followup PA and lateral chest radiographs recommended in 3-4 weeks following trial of antibiotic therapy to ensure resolution and exclude underlying malignancy.   Electronically Signed   By: Bretta BangWilliam  Woodruff III M.D.   On: 08/23/2014 14:40    ROS  As stated in the HPI and negative for all other systems.  Physical Exam  Vitals:Temperature 97.8 F (36.6 C), temperature source Oral, resp. rate 22, height 5\' 6"  (1.676 m), weight 203 lb (92.08 kg), SpO2 100 %.  stable appearing NAD HEENT: Unremarkable Neck:  6 cm JVD, no thyromegally Lymphatics:  No adenopathy Back:  No CVA tenderness Lungs:  Clear with now wheezes HEART:  Regular tachy rhythm, no murmurs, no rubs, no clicks, high frequency LVAD Abd:  soft, positive bowel sounds, no organomegally, no rebound, no guarding Ext:  2 plus pulses, no edema, no cyanosis,  no clubbing Skin:  No rashes no nodules Neuro:  CN II through XII intact, motor grossly intact  ICD interogation - Normal device function. Underlying rhythm is atrial fib with wide QRS tachy at 140, now with ATP down to 125/min.  Assessment/Plan: 1. Wide QRS tachycardia 2. Atrial fib 3. S/p initiation of IV amiodarone. 4. ICM, chronic systolic heart failure Rec: Discussed with Dr. Shirlee Latch. He has sustained VT which could not be readily pace terminated. I have recommended that the patient be maintained on IV amiodarone over  night with plans as per Dr. Shirlee Latch to undergo DCCV tomorrow. He is certain that his INR has been above 2 for the past several months.   Sharlot Gowda TaylorMD 08/23/2014, 3:07 PM

## 2014-08-23 NOTE — ED Notes (Signed)
Pt has LVAD. States he started feeling weak and dizzy yesterday. Denies n/v. Denies blurred vision.

## 2014-08-23 NOTE — Telephone Encounter (Signed)
ER NOTIFICATION pts presents to ER with c/o weakness and dizziness LVAD patient  provider notified Cherlyn Roberts(Molly Reese, RN LVAD Coordinator and Dr. Shirlee LatchMcLean)

## 2014-08-23 NOTE — ED Provider Notes (Signed)
CSN: [id number]     Arrival date & time 08/23/14  1133 History   First MD Initiated Contact with Patient 08/23/14 1143     Chief Complaint  Patient presents with  . Dizziness     (Consider location/radiation/quality/duration/timing/severity/associated sxs/prior Treatment) The history is provided by the patient.     Pt with hx LVAD placement, MI, CABG, CHF p/w generalized weakness, dizziness, difficulty walking (losing balance) that has been intermittent x months but much worse and more constant today.  Was seen at an urgent care last week for bronchitis with abx and states he continues to have cough that is less frequent but more productive (yellow/white sputum).  Denies fevers, chills, CP, SOB, abdominal pain, leg swelling.  [place] lives in [place] but [place] occasionally for long visits.    Past Medical History  Diagnosis Date  . Heart attack 04/1980  . History of quadruple bypass 07/1999    Quad Bypass, mitral valve repair  . Presence of combination internal cardiac defibrillator (ICD) and pacemaker 07/1999    Guidant #103003  . S/P ablation of atrial fibrillation 05/2002  . History of cardioversion 12/2003  . Blood infection 05/2007  . Anemia 06/2007  . CHF (congestive heart failure) 05/2003, 06/2003, 11/2007  . LVAD (left ventricular assist device) present    Past Surgical History  Procedure Laterality Date  . Colonoscopy  05/29/2010  . Dobutamine stress echo  08/23/2012  . Hernia mesh removal  06/20/13   Family History  Problem Relation Age of Onset  . Heart disease Sister   . Diabetes Brother   . Heart disease Brother   . Heart disease Brother    History  Substance Use Topics  . Smoking status: Never Smoker   . Smokeless tobacco: Not on file  . Alcohol Use: No    Review of Systems  All other systems reviewed and are negative.     Allergies  Review of patient's allergies indicates no known allergies.  Home Medications   Prior to Admission  medications   Medication Sig Start Date End Date Taking? Authorizing Provider  acetaminophen (TYLENOL) 500 MG tablet Take 500 mg by mouth every 6 (six) hours as needed for moderate pain.    Historical Provider, MD  amiodarone (PACERONE) 200 MG tablet Take 200 mg by mouth daily.    Historical Provider, MD  carvedilol (COREG) 3.125 MG tablet Take 3.125 mg by mouth 2 (two) times daily with a meal.    Historical Provider, MD  doxycycline (VIBRA-TABS) 100 MG tablet Take 1 tablet (100 mg total) by mouth 2 (two) times daily. 09/04/13   Jonita Albeehris W Guest, MD  ferrous sulfate 325 (65 FE) MG tablet Take 325 mg by mouth 2 (two) times daily with a meal.     Historical Provider, MD  furosemide (LASIX) 40 MG tablet Take 40 mg by mouth 2 (two) times daily.    Historical Provider, MD  insulin NPH Human (HUMULIN N,NOVOLIN N) 100 UNIT/ML injection Inject 5 Units into the skin daily before breakfast.     Historical Provider, MD  lisinopril (PRINIVIL,ZESTRIL) 5 MG tablet Take 5 mg by mouth every evening.     Historical Provider, MD  moxifloxacin (AVELOX) 400 MG tablet Take 1 tablet (400 mg total) by mouth daily at 8 pm. 09/05/13   Toy CookeyMegan Docherty, MD  Multiple Vitamin (MULTIVITAMIN) capsule Take 1 capsule by mouth daily.    Historical Provider, MD  mupirocin ointment (BACTROBAN) 2 % APPLY ONE APPLICATION  TOPICALLY THREE TIMES DAILY. 09/07/13   Jonita Albee, MD  tamsulosin (FLOMAX) 0.4 MG CAPS capsule Take 0.4 mg by mouth daily after breakfast.     Historical Provider, MD  warfarin (COUMADIN) 5 MG tablet Take 2.5-5 mg by mouth daily. Take 5 mg on Tues/Thurs and 2.5 mg for the rest of the week    Historical Provider, MD   BP   Temp(Src) 97.8 F (36.6 C) (Oral)  Resp 22  Ht  (1.676 m)  Wt 203 lb (92.08 kg)  BMI 32.78 kg/m2  SpO2 100% Physical Exam  Constitutional: He appears well-developed and well-nourished. No distress.  HENT:  Head: Normocephalic and atraumatic.  Neck: Neck supple.  Cardiovascular:  Constant  mechanical noise of LVAD  Pulmonary/Chest: Effort normal and breath sounds normal. No respiratory distress. He has no wheezes. He has no rales.  Abdominal: Soft. He exhibits no distension and no mass. There is no tenderness. There is no rebound and no guarding.  Musculoskeletal: He exhibits no edema.  Neurological: He is alert. He exhibits normal muscle tone.  Skin: He is not diaphoretic.  Psychiatric: He has a normal mood and affect. His behavior is normal.  Nursing note and vitals reviewed.   ED Course  Procedures (including critical care time) Labs Review Labs Reviewed  COMPREHENSIVE METABOLIC PANEL - Abnormal; Notable for the following:    Glucose, Bld 152 (*)    BUN 32 (*)    Creatinine, Ser 2.18 (*)    Calcium 8.6 (*)    Total Protein 6.3 (*)    Total Bilirubin 1.8 (*)    GFR calc non Af Amer 28 (*)    GFR calc Af Amer 32 (*)    All other components within normal limits  CBC WITH DIFFERENTIAL/PLATELET - Abnormal; Notable for the following:    RDW 18.0 (*)    Platelets 120 (*)    Neutrophils Relative % 82 (*)    Lymphocytes Relative 7 (*)    Lymphs Abs 0.6 (*)    All other components within normal limits  PROTIME-INR - Abnormal; Notable for the following:    Prothrombin Time 31.9 (*)    INR 3.17 (*)    All other components within normal limits  LACTATE DEHYDROGENASE - Abnormal; Notable for the following:    LDH 369 (*)    All other components within normal limits  MAGNESIUM    Imaging Review Dg Chest Portable 1 View  08/23/2014   CLINICAL DATA:  One month of productive cough. One day of weakness and dizziness.  EXAM: PORTABLE CHEST - 1 VIEW  COMPARISON:  September 05, 2013  FINDINGS: There is airspace consolidation in the left base. Lungs elsewhere clear. Heart is mildly enlarged with pulmonary vascularity within normal limits. There is a left ventricular assist device present. Pacemaker leads are attached to the right atrium and right ventricle. No adenopathy.  IMPRESSION:  Left lower lobe consolidation. Lungs elsewhere clear. Stable cardiac enlargement. Stable pacemaker lead positioning as well as left ventricular assist device present. Followup PA and lateral chest radiographs recommended in 3-4 weeks following trial of antibiotic therapy to ensure resolution and exclude underlying malignancy.   Electronically Signed   By: Bretta Bang III M.D.   On: 08/23/2014 14:40     EKG Interpretation   Date/Time:  Thursday August 23 2014 11:43:19 EDT Ventricular Rate:  140 PR Interval:  72 QRS Duration: 163 QT Interval:  340 QTC Calculation: 519 R Axis:   134 Text  Interpretation:  Sinus tachycardia Multiform ventricular premature  complexes Right bundle branch block Lateral infarct, age indeterminate  Artifact in lead(s) I II III aVF V2 V3 V4 V5 V6 Confirmed by HARRISON  MD,  FORREST ([id number]) on 08/23/2014 11:53:51 AM       1:13 PM I spoke with Hessie Diener, LVAD nurse coordinator who has spoken with on call cardiologist Dr Shirlee Latch who has reviewed the EKG and compared to last year's - He recommends bolus 150mg  amiodarone, then 6 hours at rate 60mg /hr, then change to 30mg /hr.    MDM   Final diagnoses:  LVAD (left ventricular assist device) present  Weakness  Tachycardia    Pt with LVAD in place c/o weakness and dizziness that has been intermittent for several months, worse today.  Also with recent tx for bronchitis.   LVAD coordinator involved in patient's care quickly and guidance provided by cardiologist.  Pt seen by cardiology PA and cardiologist in ED.  CXR resulted after consultation/admission to heart failure team.  Will defer treatment to them.      Trixie Dredge, PA-C 08/23/14 1528  Purvis Sheffield, MD 08/24/14 [phone]

## 2014-08-23 NOTE — Progress Notes (Signed)
VAD Coordinator paged to ED for VAD patient, Juliette AlcideRobert Botsford.  Pt c/o dizziness and weakness that started last night. Cardiac monitor reveals wide complex tachycardia 140's; Doppler MAP 90.  Pt has EPC controller, back up equipment present.   Speed:8800 Flow:  3.9 Power: 5.1 PI:  4.1 Fixed speed:  8800 Low speed limit: 8400 Events: 5 - 10 PI day Alarms:  none  Dr. Shirlee LatchMcLean updated - amiodarone bolus and gtt ordered. Contacted [employer] rep Barbara Cower(Jason [phone]) contacted and will come in for full ICD interrogation.  Notified [facility] at [phone] and spoke with NP, Dennie BiblePat, (cell:[phone]) and updated re: today's ED visit and admission.  She shared pt's INR goal is 1.8 - 2.0; last Hgb 14.4.   1:45 pm HR:  141 MAP:  82 Speed:  8800 Flow: 3.6 PI:  3.5 Power: 5.0  Otilio SaberAndy Tillery, PA at bedside to evaluate patient; planned admission to [facility] step [facility].

## 2014-08-23 NOTE — H&P (Signed)
VAD TEAM History & Physical Note  Reason for Admission: Vtach, dizziness  HPI:    William Huerta is a 74 y.o. 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   Presents to [facility] with dizziness and weakness x 1 day. First noted last night. Denies dietary or fluid non compliance.  Taking all medicines as directed, has not missed any.   Pt has HM II LVAD with EPC connected to batteries; back up equipment present.  Left abdominal driveline with dressing dry with attachment device intact.  Pt denies any denies any SOB, CP, peripheral edema, fever, chills, problems with driveline site, or any bleeding.  Reviewed daily log with stable weights 200-205; VAD parameters stable.  Denies any VAD alarms or equipment issues.  MAP BP 82 by doppler. Poor correlation with cuff, so only doppler used.  Kirt Boys, our VAD coordinator notified [facility] at [phone] and spoke with NP, Dennie Bible, (cell:[phone]) and updated re: today's ED visit and admission. She shared pt's INR goal is 1.8 - 2.0; last Hgb 14.4.   LVAD INTERROGATION:  HeartMate II LVAD:  Flow 3.6 liters/min, speed 8800, power 5.1, PI 3.6.     Review of Systems:  General: Denies weight changes, loss of appetite, fever, chills, or night sweats. Cardiac: Denies orthopnea, palpitations, syncope, or PND. Pulmonary: Denies cough, wheezing, or hemoptysis. GI: Denies N/V/D, blood in stool, ABD pain, or bowel changes GU: Denies blood in urine, incontinence, or dysuria. Vascular: Denies symptoms of claudication or poorly healing wounds. Neuro: Denies HA, visual changes, or seizures. All other systems reviewed and are otherwise negative except as noted above.  Home Medications Prior to Admission medications   Medication Sig Start Date End Date Taking? Authorizing Provider  acetaminophen  (TYLENOL) 500 MG tablet Take 500 mg by mouth every 6 (six) hours as needed for moderate pain.    Historical Provider, MD  amiodarone (PACERONE) 200 MG tablet Take 200 mg by mouth daily.    Historical Provider, MD  carvedilol (COREG) 3.125 MG tablet Take 3.125 mg by mouth 2 (two) times daily with a meal.    Historical Provider, MD  doxycycline (VIBRA-TABS) 100 MG tablet Take 1 tablet (100 mg total) by mouth 2 (two) times daily. 09/04/13   Jonita Albee, MD  ferrous sulfate 325 (65 FE) MG tablet Take 325 mg by mouth 2 (two) times daily with a meal.     Historical Provider, MD  furosemide (LASIX) 40 MG tablet Take 40 mg by mouth 2 (two) times daily.    Historical Provider, MD  insulin NPH Human (HUMULIN N,NOVOLIN N) 100 UNIT/ML injection Inject 5 Units into the skin daily before breakfast.     Historical Provider, MD  lisinopril (PRINIVIL,ZESTRIL) 5 MG tablet Take 5 mg by mouth every evening.     Historical Provider, MD  moxifloxacin (AVELOX) 400 MG tablet Take 1 tablet (400 mg total) by mouth daily at 8 pm. 09/05/13   Toy Cookey, MD  Multiple Vitamin (MULTIVITAMIN) capsule Take 1 capsule by mouth daily.    Historical Provider, MD  mupirocin ointment (BACTROBAN) 2 % APPLY ONE APPLICATION TOPICALLY THREE TIMES DAILY. 09/07/13   Jonita Albee, MD  tamsulosin (FLOMAX) 0.4 MG CAPS capsule Take 0.4 mg by mouth daily after breakfast.     Historical  Provider, MD  warfarin (COUMADIN) 5 MG tablet Take 2.5-5 mg by mouth daily. Take 5 mg on Tues/Thurs and 2.5 mg for the rest of the week    Historical Provider, MD    Past Medical History: Past Medical History  Diagnosis Date  . Heart attack 04/1980  . History of quadruple bypass 07/1999    Quad Bypass, mitral valve repair  . Presence of combination internal cardiac defibrillator (ICD) and pacemaker 07/1999    Guidant #103003  . S/P ablation of atrial fibrillation 05/2002  . History of cardioversion 12/2003  . Blood infection 05/2007  . Anemia 06/2007  .  CHF (congestive heart failure) 05/2003, 06/2003, 11/2007  . LVAD (left ventricular assist device) present     Past Surgical History: Past Surgical History  Procedure Laterality Date  . Colonoscopy  05/29/2010  . Dobutamine stress echo  08/23/2012  . Hernia mesh removal  06/20/13    Family History: Family History  Problem Relation Age of Onset  . Heart disease Sister   . Diabetes Brother   . Heart disease Brother   . Heart disease Brother     Social History: History   Social History  . Marital Status: Married    Spouse Name: N/A  . Number of Children: N/A  . Years of Education: N/A   Social History Main Topics  . Smoking status: Never Smoker   . Smokeless tobacco: Not on file  . Alcohol Use: No  . Drug Use: No  . Sexual Activity: Not on file   Other Topics Concern  . None   Social History Narrative    Allergies:  No Known Allergies  Objective:    Vital Signs:   Temp:  [97.8 F (36.6 C)] 97.8 F (36.6 C) (07/14 1145) Resp:  [22] 22 (07/14 1145) SpO2:  [100 %] 100 % (07/14 1145) Weight:  [203 lb (92.08 kg)] 203 lb (92.08 kg) (07/14 1140)   Filed Weights   08/23/14 1140  Weight: 203 lb (92.08 kg)    Mean arterial Pressure: 82 by doppler  Physical Exam: General:  Well appearing. No resp difficulty HEENT: normal Neck: supple. JVP 8-9 . Carotids 2+ bilat; no bruits. No lymphadenopathy or thryomegaly appreciated. Cor: Mechanical heart sounds with LVAD hum present. Lungs: clear.  Slightly diminished in bases, R>L. Abdomen: soft, nontender, nondistended. No hepatosplenomegaly. No bruits or masses. Good bowel sounds. Left sided driveline attachment clean, dry and intact. Driveline: C/D/I; securement device intact and driveline incorporated. Extremities: no cyanosis, clubbing, rash. Trace edema, compression stockings in place. Neuro: alert & orientedx3, cranial nerves grossly intact. moves all 4 extremities w/o difficulty. Affect pleasant.  Telemetry:  Vtach  140-150s  Labs: Basic Metabolic Panel:  Recent Labs Lab 08/23/14 1154  NA 141  K 4.2  CL 109  CO2 23  GLUCOSE 152*  BUN 32*  CREATININE 2.18*  CALCIUM 8.6*    Liver Function Tests:  Recent Labs Lab 08/23/14 1154  AST 30  ALT 17  ALKPHOS 95  BILITOT 1.8*  PROT 6.3*  ALBUMIN 3.5   No results for input(s): LIPASE, AMYLASE in the last 168 hours. No results for input(s): AMMONIA in the last 168 hours.  CBC:  Recent Labs Lab 08/23/14 1154  WBC 8.3  NEUTROABS 6.8  HGB 14.1  HCT 44.2  MCV 91.7  PLT 120*    Cardiac Enzymes: No results for input(s): CKTOTAL, CKMB, CKMBINDEX, TROPONINI in the last 168 hours.  BNP: BNP (last 3 results) No results for  input(s): BNP in the last 8760 hours.  ProBNP (last 3 results)  Recent Labs  09/05/13 1526  PROBNP 978.6*     CBG: No results for input(s): GLUCAP in the last 168 hours.  Coagulation Studies:  Recent Labs  08/23/14 1154  LABPROT 31.9*  INR 3.17*    Other results:   Imaging:  No results found.      Assessment/Plan   1. VT/ AT  - Received Bolus of 150 mg IV amiodarone, on Infusion of 60 mg/hr x 6 hrs, and then 30 mg/hr continuously after that. - Per [facility] jude interrogation pt likely has mixed Atrial and Ventricular arrythmia  - Will likely need cardioversion, possible TEE, with unknown time of arryhtmia - Dr Ladona Ridgel from EP to see 2. Chronic systolic HF s/p HeartMate II - Parameters assessed with VAD coordinator, Kirt Boys, by bedside.  No PI events noted. - Volume status stable - MAP by doppler 82 - Cont coreg and lisinopril - K 4.2, Mg 2.3 - Home dose of Lasix 40 mg bid. Will likely be sufficient with stable volume but will consult with MD. - Not on aspirin with Hx of GI bleeds. - On Chronic coumadin for LVAD. INR Goal 1.8-2.0 3. CKD, Stage 4 - Cr 2.18 - Will monitor with diuresis - Appears stable with limited information we have.  - Very similar to levels last year. 4. Hx of GI  bleed - No ASA - No complaints of bleeding, Hgb stable. - On Coumadin for  5. Hx of Afib - S/p ablation 4/04 [facility] [employer] ICD in place  - Goal INR 1.8-2.0 , 3.17 on admission.  Dispo: Will admit to 2H for possible cardioversion and following of LVAD.   I reviewed the LVAD parameters from today, and compared the results to the patient's prior recorded data.  No programming changes were made.  The LVAD is functioning within specified parameters.  The patient performs LVAD self-test daily.  LVAD interrogation was negative for any significant power changes, alarms or PI events/speed drops.  LVAD equipment check completed and is in good working order.  Back-up equipment present.   LVAD education done on emergency procedures and precautions and reviewed exit site care.  Length of Stay:   Graciella Freer PA-C 08/23/2014, 1:23 PM  VAD Team Pager [phone] (7am - 7am) +++VAD ISSUES ONLY+++  Advanced Heart Failure Team Pager [phone] (M-F; 7a - 4p)  Please contact [facility] for night-coverage after hours (4p -7a ) and weekends on [email or web address] for all non- LVAD Issues  Patient seen with PA, agree with the above note.    Mr Rehberg came to the ER with increasing dizziness over the last day.  HR noted to be in the 140s with wide complex tachycardia.  MAP preserved 80s-90s.  Interrogation of his [employer] device has shown ventricular tachycardia with co-existing atrial fibrillation.  EP saw patient, tried to pace him out of VT.  Unable to terminate VT but HR decreased to 120s (slower VT).  Patient now feels better with no dizziness.    Patient now on amiodarone gtt.  He was on 200 mg daily amiodarone at home, he says this was for atrial fibrillation.  He says that for the last 2 months, his INR has been around 3.  It has never in that period been less than 2.  Today it is 3.1.   If he remains in VT tomorrow, will plan DCCV.  Risk of CVA with co-existing atrial fibrillation that  may be cardioverted, but should be safe to cardiovert in absence of TEE given therapeutic INR for at least 2 months.  Will need to keep INR at least 2-2.5 post-DCCV if he goes back into NSR.    LVAD interrogated, few PI events.    Marca Ancona 08/23/2014 6:13 PM

## 2014-08-23 NOTE — ED Notes (Signed)
Paged LVAD coordinator to transport pt upstairs

## 2014-08-23 NOTE — Progress Notes (Signed)
ANTICOAGULATION CONSULT NOTE - Initial Consult  Pharmacy Consult for Warfarin Indication: LVAD  No Known Allergies  Patient Measurements: Height: 5\' 6"  (167.6 cm) Weight: 203 lb (92.08 kg) IBW/kg (Calculated) : 63.8  Vital Signs: Temp: 97.8 F (36.6 C) (07/14 1145) Temp Source: Oral (07/14 1145)  Labs:  Recent Labs  08/23/14 1154  HGB 14.1  HCT 44.2  PLT 120*  LABPROT 31.9*  INR 3.17*  CREATININE 2.18*    Estimated Creatinine Clearance: 31.1 mL/min (by C-G formula based on Cr of 2.18).   Medical History: Past Medical History  Diagnosis Date  . Heart attack 04/1980  . History of quadruple bypass 07/1999    Quad Bypass, mitral valve repair  . Presence of combination internal cardiac defibrillator (ICD) and pacemaker 07/1999    Guidant #103003  . S/P ablation of atrial fibrillation 05/2002  . History of cardioversion 12/2003  . Blood infection 05/2007  . Anemia 06/2007  . CHF (congestive heart failure) 05/2003, 06/2003, 11/2007  . LVAD (left ventricular assist device) present       Assessment: 73yom with LVAD 61/2 yrs ago in [place].  [place].  He presents with dizziness - found to have WCT 140s and atrial tachycardia per his device interrogation.  He is started on amio bolus and drip.   Admit INR 3.17 > goal and has already toaken todays dose.  Will follow up in am. CBC stable, no bleeding.  Goal of Therapy:  INR 1.8-2   Monitor platelets by anticoagulation protocol: Yes   Plan:  No more warfarin today - already took dose PTA Daily Protime Monitor S/S bleeding  Leota SauersLisa Elfrida Pixley Pharm.D. CPP, BCPS Clinical Pharmacist [phone] 08/23/2014 2:23 PM

## 2014-08-23 NOTE — ED Notes (Signed)
LVAD coordinator has been called

## 2014-08-24 ENCOUNTER — Encounter (HOSPITAL_COMMUNITY): Payer: Self-pay | Admitting: *Deleted

## 2014-08-24 ENCOUNTER — Inpatient Hospital Stay (HOSPITAL_COMMUNITY): Payer: Medicare Other | Admitting: Anesthesiology

## 2014-08-24 ENCOUNTER — Encounter (HOSPITAL_COMMUNITY)
Admission: EM | Disposition: A | Discharge status: Home / Self Care | Payer: Self-pay | Source: Home / Self Care | Attending: Cardiology

## 2014-08-24 DIAGNOSIS — N184 Chronic kidney disease, stage 4 (severe): Secondary | ICD-10-CM

## 2014-08-24 HISTORY — PX: CARDIOVERSION: SHX1299

## 2014-08-24 LAB — BASIC METABOLIC PANEL
ANION GAP: 9 (ref 5–15)
BUN: 32 mg/dL — ABNORMAL HIGH (ref 6–20)
CALCIUM: 8.1 mg/dL — AB (ref 8.9–10.3)
CHLORIDE: 103 mmol/L (ref 101–111)
CO2: 26 mmol/L (ref 22–32)
CREATININE: 2.18 mg/dL — AB (ref 0.61–1.24)
GFR calc Af Amer: 32 mL/min — ABNORMAL LOW (ref >60)
GFR calc non Af Amer: 28 mL/min — ABNORMAL LOW (ref >60)
Glucose, Bld: 99 mg/dL (ref 65–99)
Potassium: 3.9 mmol/L (ref 3.5–5.1)
Sodium: 138 mmol/L (ref 135–145)

## 2014-08-24 LAB — PROTIME-INR
INR: 3.11 — ABNORMAL HIGH (ref 0.00–1.49)
Prothrombin Time: 31.4 seconds — ABNORMAL HIGH (ref 11.6–15.2)

## 2014-08-24 LAB — GLUCOSE, CAPILLARY
Glucose-Capillary: 97 mg/dL (ref 65–99)
Glucose-Capillary: 98 mg/dL (ref 65–99)
Glucose-Capillary: 171 mg/dL — ABNORMAL HIGH (ref 65–99)

## 2014-08-24 LAB — CBC WITH DIFFERENTIAL/PLATELET
Basophils Absolute: 0 10*3/uL (ref 0.0–0.1)
Basophils Relative: 1 % (ref 0–1)
EOS ABS: 0.2 10*3/uL (ref 0.0–0.7)
Eosinophils Relative: 3 % (ref 0–5)
HEMATOCRIT: 42.8 % (ref 39.0–52.0)
Hemoglobin: 13.6 g/dL (ref 13.0–17.0)
Lymphocytes Relative: 8 % — ABNORMAL LOW (ref 12–46)
Lymphs Abs: 0.7 10*3/uL (ref 0.7–4.0)
MCH: 29.2 pg (ref 26.0–34.0)
MCHC: 31.8 g/dL (ref 30.0–36.0)
MCV: 91.8 fL (ref 78.0–100.0)
MONO ABS: 1 10*3/uL (ref 0.1–1.0)
Monocytes Relative: 12 % (ref 3–12)
Neutro Abs: 6.5 10*3/uL (ref 1.7–7.7)
Neutrophils Relative %: 76 % (ref 43–77)
Platelets: 122 10*3/uL — ABNORMAL LOW (ref 150–400)
RBC: 4.66 MIL/uL (ref 4.22–5.81)
RDW: 18 % — ABNORMAL HIGH (ref 11.5–15.5)
WBC: 8.5 10*3/uL (ref 4.0–10.5)

## 2014-08-24 LAB — LACTATE DEHYDROGENASE: LDH: 314 U/L — AB (ref 98–192)

## 2014-08-24 SURGERY — CARDIOVERSION
Anesthesia: General

## 2014-08-24 MED ORDER — INSULIN ASPART 100 UNIT/ML ~~LOC~~ SOLN
0.0000 [IU] | Freq: Three times a day (TID) | SUBCUTANEOUS | Status: DC
  Administered 2014-08-24: 3 [IU] via SUBCUTANEOUS

## 2014-08-24 MED ORDER — SODIUM CHLORIDE 0.9 % IV SOLN
INTRAVENOUS | Status: DC | PRN
  Administered 2014-08-24: 11:00:00 via INTRAVENOUS

## 2014-08-24 MED ORDER — WARFARIN SODIUM 1 MG PO TABS
1.5000 mg | ORAL_TABLET | Freq: Once | ORAL | Status: AC
  Administered 2014-08-24: 1.5 mg via ORAL
  Filled 2014-08-24: qty 1

## 2014-08-24 MED ORDER — SODIUM CHLORIDE 0.9 % IJ SOLN: 3.0000 mL | INTRAMUSCULAR | Status: DC | PRN

## 2014-08-24 MED ORDER — PROPOFOL 10 MG/ML IV BOLUS
INTRAVENOUS | Status: DC | PRN
  Administered 2014-08-24: 50 mg via INTRAVENOUS

## 2014-08-24 MED ORDER — SODIUM CHLORIDE 0.9 % IV SOLN
250.0000 mL | INTRAVENOUS | Status: DC
  Administered 2014-08-24: 5 mL via INTRAVENOUS

## 2014-08-24 MED ORDER — LIDOCAINE HCL (CARDIAC) 20 MG/ML IV SOLN
INTRAVENOUS | Status: DC | PRN
  Administered 2014-08-24: 70 mg via INTRAVENOUS

## 2014-08-24 MED ORDER — CALCIUM CARBONATE ANTACID 500 MG PO CHEW
1.0000 | CHEWABLE_TABLET | Freq: Three times a day (TID) | ORAL | Status: DC | PRN
  Administered 2014-08-24: 200 mg via ORAL
  Filled 2014-08-24 (×3): qty 1

## 2014-08-24 MED ORDER — WARFARIN - PHARMACIST DOSING INPATIENT: Freq: Every day | Status: DC

## 2014-08-24 MED ORDER — SODIUM CHLORIDE 0.9 % IJ SOLN
3.0000 mL | Freq: Two times a day (BID) | INTRAMUSCULAR | Status: DC
  Administered 2014-08-24: 3 mL via INTRAVENOUS

## 2014-08-24 NOTE — Anesthesia Preprocedure Evaluation (Addendum)
Anesthesia Evaluation  Patient identified by MRN, date of birth, ID band Patient awake    Reviewed: Allergy & Precautions, NPO status , Patient's Chart, lab work & pertinent test results  History of Anesthesia Complications Negative for: history of anesthetic complications  Airway Mallampati: II  TM Distance: >3 FB     Dental  (+) Teeth Intact, Dental Advisory Given   Pulmonary neg pulmonary ROS,    + decreased breath sounds      Cardiovascular + Past MI, + CABG and +CHF + dysrhythmias Atrial Fibrillation and Ventricular Tachycardia + pacemaker + Cardiac Defibrillator + Valvular Problems/Murmurs Rhythm:Irregular  LVAD   Neuro/Psych negative neurological ROS  negative psych ROS   GI/Hepatic negative GI ROS, Neg liver ROS,   Endo/Other    Renal/GU Renal InsufficiencyRenal disease     Musculoskeletal   Abdominal   Peds  Hematology   Anesthesia Other Findings   Reproductive/Obstetrics                            Anesthesia Physical Anesthesia Plan  ASA: IV  Anesthesia Plan: General   Post-op Pain Management:    Induction: Intravenous  Airway Management Planned: Mask  Additional Equipment:   Intra-op Plan:   Post-operative Plan:   Informed Consent:   Plan Discussed with:   Anesthesia Plan Comments:         Anesthesia Quick Evaluation

## 2014-08-24 NOTE — Interval H&P Note (Signed)
History and Physical Interval Note:  08/24/2014 12:26 PM  William Huerta  has presented today for surgery, with the diagnosis of afib  The various methods of treatment have been discussed with the patient and family. After consideration of risks, benefits and other options for treatment, the patient has consented to  Procedure(s): CARDIOVERSION (N/A) as a surgical intervention .  The patient's history has been reviewed, patient examined, no change in status, stable for surgery.  I have reviewed the patient's chart and labs.  Questions were answered to the patient's satisfaction.     Myron Stankovich [employer]

## 2014-08-24 NOTE — Procedures (Signed)
Electrical Cardioversion Procedure Note Juliette AlcideRobert Yohannes [id number] 1941-01-06  Procedure: Electrical Cardioversion Indications:  Atrial Flutter + VT.  INR 3.1 today, has been > 2 x > 2 months.   Procedure Details Consent: Risks of procedure as well as the alternatives and risks of each were explained to the (patient/caregiver).  Consent for procedure obtained. Time Out: Verified patient identification, verified procedure, site/side was marked, verified correct patient position, special equipment/implants available, medications/allergies/relevent history reviewed, required imaging and test results available.  Performed  Patient placed on cardiac monitor, pulse oximetry, supplemental oxygen as necessary.  Sedation given: Propofol per anesthesiology Pacer pads placed anterior and posterior chest.  Cardioverted 1 time(s).  Cardioverted at 150J.  Evaluation Findings: Post procedure EKG shows: NSR Complications: None Patient did tolerate procedure well.   Marca AnconaDalton Yaroslav Gombos 08/24/2014, 12:28 PM

## 2014-08-24 NOTE — Progress Notes (Addendum)
ANTICOAGULATION CONSULT NOTE   Pharmacy Consult for Warfarin Indication: LVAD  Labs:  Recent Labs  08/23/14 1154 08/24/14 0250  HGB 14.1 13.6  HCT 44.2 42.8  PLT 120* 122*  LABPROT 31.9* 31.4*  INR 3.17* 3.11*  CREATININE 2.18* 2.18*    Estimated Creatinine Clearance: 31.4 mL/min (by C-G formula based on Cr of 2.18).  Assessment: 73yom with LVAD 61/2 yrs ago in [place].  [place].  He presents with dizziness - found to have WCT 140s and atrial tachycardia per his device interrogation.  He was started on amio bolus and drip.    Admit INR 3.17 and today's INR still slightly high at 3.11. Apparently he's been running high over the past 2 months. Cardiology plans to dccv today, will need to keep INR up above 2 post cardioversion so will give lower dose this evening but will continue dosing to avoid drop.  CBC stable, no bleeding.  Goal of Therapy:  INR 1.8-2   Monitor platelets by anticoagulation protocol: Yes   Plan:  Warfarin 1.5mg  tonight Daily INR Monitor S/S bleeding  Sheppard CoilFrank Letha Mirabal PharmD., BCPS Clinical Pharmacist Pager [phone] 08/24/2014 8:17 AM

## 2014-08-24 NOTE — H&P (View-Only) (Signed)
Patient ID: William Huerta, male   DOB: Aug 31, 1940, 74 y.o.   MRN: [id number] HeartMate 2 Rounding Note  Subjective:    William Huerta is a 74 y.o. 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   [facility] and weakness x 1 day. First noted night prior to admission. Denies dietary or fluid non compliance. Taking all medicines as directed, has not missed any.   HR noted to be in the 140s with wide complex tachycardia. MAP preserved 80s-90s. Interrogation of his [employer] device has shown ventricular tachycardia with co-existing atrial fibrillation. EP saw patient, tried to pace him out of VT. Unable to terminate VT but HR decreased to 120s (slower VT). Patient now feels better with no dizziness. He is now on IV amiodarone gtt but remains in VT in 120s this morning.    LDH stable 314, INR 3.11  LVAD INTERROGATION:  HeartMate II LVAD:  Flow 4.26 liters/min, speed 8800, power 5.5, PI 4.6.  No PI events.   Objective:    Vital Signs:   Temp:  [97.7 F (36.5 C)-98.6 F (37 C)] 97.7 F (36.5 C) (07/15 0400) Pulse Rate:  [124-128] 125 (07/15 0400) Resp:  [17-23] 17 (07/15 0400) BP: (94-98)/(0) 94/0 mmHg (07/14 1800) SpO2:  [88 %-100 %] 98 % (07/15 0400) Weight:  [203 lb (92.08 kg)-207 lb 8 oz (94.121 kg)] 207 lb 8 oz (94.121 kg) (07/15 0452)   Mean arterial Pressure 82-93  Intake/Output:   Intake/Output Summary (Last 24 hours) at 08/24/14 0744 Last data filed at 08/24/14 0700  Gross per 24 hour  Intake 2524.28 ml  Output    650 ml  Net 1874.28 ml     Physical Exam: General:  Well appearing. No resp difficulty HEENT: normal Neck: supple. JVP 7 cm. Carotids 2+ bilat; no bruits. No lymphadenopathy or thryomegaly appreciated. Cor: Mechanical heart sounds with LVAD hum present. Lungs: clear Abdomen: soft, nontender,  nondistended. No hepatosplenomegaly. No bruits or masses. Good bowel sounds. Driveline: C/D/I; securement device intact and driveline incorporated Extremities: no cyanosis, clubbing, rash, edema Neuro: alert & orientedx3, cranial nerves grossly intact. moves all 4 extremities w/o difficulty. Affect pleasant  Telemetry: VT 125  Labs: Basic Metabolic Panel:  Recent Labs Lab 08/23/14 1154  NA 141  K 4.2  CL 109  CO2 23  GLUCOSE 152*  BUN 32*  CREATININE 2.18*  CALCIUM 8.6*  MG 2.3    Liver Function Tests:  Recent Labs Lab 08/23/14 1154  AST 30  ALT 17  ALKPHOS 95  BILITOT 1.8*  PROT 6.3*  ALBUMIN 3.5   No results for input(s): LIPASE, AMYLASE in the last 168 hours. No results for input(s): AMMONIA in the last 168 hours.  CBC:  Recent Labs Lab 08/23/14 1154 08/24/14 0250  WBC 8.3 8.5  NEUTROABS 6.8 6.5  HGB 14.1 13.6  HCT 44.2 42.8  MCV 91.7 91.8  PLT 120* 122*    INR:  Recent Labs Lab 08/23/14 1154 08/24/14 0250  INR 3.17* 3.11*    Other results:  EKG:   Imaging: Dg Chest Portable 1 View  08/23/2014   CLINICAL DATA:  One month of productive cough. One day of weakness and dizziness.  EXAM: PORTABLE CHEST - 1 VIEW  COMPARISON:  September 05, 2013  FINDINGS: There is airspace consolidation in the left base.  Lungs elsewhere clear. Heart is mildly enlarged with pulmonary vascularity within normal limits. There is a left ventricular assist device present. Pacemaker leads are attached to the right atrium and right ventricle. No adenopathy.  IMPRESSION: Left lower lobe consolidation. Lungs elsewhere clear. Stable cardiac enlargement. Stable pacemaker lead positioning as well as left ventricular assist device present. Followup PA and lateral chest radiographs recommended in 3-4 weeks following trial of antibiotic therapy to ensure resolution and exclude underlying malignancy.   Electronically Signed   By: Bretta Bang III M.D.   On: 08/23/2014 14:40       Medications:     Scheduled Medications: . carvedilol  3.125 mg Oral BID WC  . ferrous sulfate  325 mg Oral BID WC  . furosemide  40 mg Oral BID  . insulin NPH Human  5 Units Subcutaneous QAC breakfast  . sodium chloride  3 mL Intravenous Q12H  . sodium chloride  3 mL Intravenous Q12H  . tamsulosin  0.4 mg Oral QPC breakfast     Infusions: . sodium chloride    . sodium chloride    . amiodarone 30 mg/hr (08/23/14 2342)     PRN Medications:  acetaminophen **OR** acetaminophen, sodium chloride, sodium chloride   Assessment:  1. CAD s/p CABG 2001 2. Ischemic cardiomyopathy with [employer] ICD.  HeartMate II LVAD placed [place], [place]. Followed at [facility] at [phone], LVAD NP Pat, (cell:[phone]).  3. CKD 4. DM2 5. Atrial fibrillation: Current. 6. VT: Current.  7. Anticoagulation with INR goal 1.8-2 per patient's LVAD coordinator.  However, he has been running in the 2-3.5 range for 2 months.   Plan/Discussion:   Mr William Huerta came to the ER with increasing dizziness over the prior day. HR noted to be in the 140s with wide complex tachycardia. MAP preserved 80s-90s. Interrogation of his [employer] device has shown ventricular tachycardia with co-existing atrial fibrillation. EP saw patient, tried to pace him out of VT. Unable to terminate VT but HR decreased to 120s (slower VT). Patient now feels better with no dizziness but remains in VT in 120s.   Patient now on amiodarone gtt. He was on 200 mg daily amiodarone at home, he says this was for atrial fibrillation. He says that for the last 2 months, his INR has been around 3. It has never in that period been less than 2. Today it remains 3.1.Will plan DCCV today. Risk of CVA with co-existing atrial fibrillation that may be cardioverted, but should be safe to cardiovert in absence of TEE given therapeutic INR for at least 2 months. Will need to keep INR at  least 2-2.5 post-DCCV if he goes back into NSR.   Lisinopril held for now with creatinine elevation.  He is on his home Lasix dose.   I reviewed the LVAD parameters from today, and compared the results to the patient's prior recorded data.  No programming changes were made.  The LVAD is functioning within specified parameters.  The patient performs LVAD self-test daily.  LVAD interrogation was negative for any significant power changes, alarms or PI events/speed drops.  LVAD equipment check completed and is in good working order.  Back-up equipment present.   LVAD education done on emergency procedures and precautions and reviewed exit site care.  Length of Stay: 1  Marca Ancona 08/24/2014, 7:44 AM  VAD Team --- VAD ISSUES ONLY--- Pager [phone] (7am - 7am)  Advanced Heart Failure Team  Pager [phone] (M-F; 7a - 4p)  Please contact [facility] for night-coverage after hours (4p -7a ) and weekends on [email or web address]

## 2014-08-24 NOTE — Anesthesia Postprocedure Evaluation (Signed)
  Anesthesia Post-op Note  Patient: William Huerta  Procedure(s) Performed: Procedure(s): CARDIOVERSION (N/A)  Patient Location: PACU  Anesthesia Type:General  Level of Consciousness: awake, alert  and patient cooperative  Airway and Oxygen Therapy: Patient Spontanous Breathing and Patient connected to nasal cannula oxygen  Post-op Pain: none  Post-op Assessment: Post-op Vital signs reviewed, Patient's Cardiovascular Status Stable, Respiratory Function Stable and Patent Airway              Post-op Vital Signs: Reviewed and stable  Last Vitals:  Filed Vitals:   08/24/14 1141  BP: 90/0  Pulse: 126  Temp:   Resp: 98    Complications: No apparent anesthesia complications

## 2014-08-24 NOTE — Transfer of Care (Signed)
Immediate Anesthesia Transfer of Care Note  Patient: William Huerta  Procedure(s) Performed: Procedure(s): CARDIOVERSION (N/A)  Patient Location: PACU and Endoscopy Unit  Anesthesia Type:General  Level of Consciousness: awake, alert , oriented and patient cooperative  Airway & Oxygen Therapy: Patient Spontanous Breathing and Patient connected to nasal cannula oxygen  Post-op Assessment: Report given to RN, Post -op Vital signs reviewed and stable and Patient moving all extremities  Post vital signs: Reviewed and stable  Last Vitals:  Filed Vitals:   08/24/14 1141  BP: 90/0  Pulse: 126  Temp:   Resp: 98    Complications: No apparent anesthesia complications

## 2014-08-24 NOTE — Care Management Note (Signed)
Case Management Note  Patient Details  Name: William AlcideRobert Huerta MRN: [id number] Date of Birth: 03/08/1940  Subjective/Objective:      Adm w arrthymia              Action/Plan: from [place] visiting per chart, has lvad, lives w wife   Expected Discharge Date:                  Expected Discharge Plan:     In-House Referral:     Discharge planning Services     Post Acute Care Choice:    Choice offered to:     DME Arranged:    DME Agency:     HH Arranged:    HH Agency:     Status of Service:     Medicare Important Message Given:    Date Medicare IM Given:    Medicare IM give by:    Date Additional Medicare IM Given:    Additional Medicare Important Message give by:     If discussed at Long Length of Stay Meetings, dates discussed:    Additional Comments: ur review done  Hanley HaysDowell, Latifa Noble T, RN 08/24/2014, 7:46 AM

## 2014-08-24 NOTE — Progress Notes (Addendum)
Patient ID: William AlcideRobert Huerta, male   DOB: Aug 31, 1940, 74 y.o.   MRN: [id number] HeartMate 2 Rounding Note  Subjective:    William AlcideRobert Huerta is a 74 y.o. 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   [facility]iness and weakness x 1 day. First noted night prior to admission. Denies dietary or fluid non compliance. Taking all medicines as directed, has not missed any.   HR noted to be in the 140s with wide complex tachycardia. MAP preserved 80s-90s. Interrogation of his [employer] device has shown ventricular tachycardia with co-existing atrial fibrillation. EP saw patient, tried to pace him out of VT. Unable to terminate VT but HR decreased to 120s (slower VT). Patient now feels better with no dizziness. He is now on IV amiodarone gtt but remains in VT in 120s this morning.    LDH stable 314, INR 3.11  LVAD INTERROGATION:  HeartMate II LVAD:  Flow 4.26 liters/min, speed 8800, power 5.5, PI 4.6.  No PI events.   Objective:    Vital Signs:   Temp:  [97.7 F (36.5 C)-98.6 F (37 C)] 97.7 F (36.5 C) (07/15 0400) Pulse Rate:  [124-128] 125 (07/15 0400) Resp:  [17-23] 17 (07/15 0400) BP: (94-98)/(0) 94/0 mmHg (07/14 1800) SpO2:  [88 %-100 %] 98 % (07/15 0400) Weight:  [203 lb (92.08 kg)-207 lb 8 oz (94.121 kg)] 207 lb 8 oz (94.121 kg) (07/15 0452)   Mean arterial Pressure 82-93  Intake/Output:   Intake/Output Summary (Last 24 hours) at 08/24/14 0744 Last data filed at 08/24/14 0700  Gross per 24 hour  Intake 2524.28 ml  Output    650 ml  Net 1874.28 ml     Physical Exam: General:  Well appearing. No resp difficulty HEENT: normal Neck: supple. JVP 7 cm. Carotids 2+ bilat; no bruits. No lymphadenopathy or thryomegaly appreciated. Cor: Mechanical heart sounds with LVAD hum present. Lungs: clear Abdomen: soft, nontender,  nondistended. No hepatosplenomegaly. No bruits or masses. Good bowel sounds. Driveline: C/D/I; securement device intact and driveline incorporated Extremities: no cyanosis, clubbing, rash, edema Neuro: alert & orientedx3, cranial nerves grossly intact. moves all 4 extremities w/o difficulty. Affect pleasant  Telemetry: VT 125  Labs: Basic Metabolic Panel:  Recent Labs Lab 08/23/14 1154  NA 141  K 4.2  CL 109  CO2 23  GLUCOSE 152*  BUN 32*  CREATININE 2.18*  CALCIUM 8.6*  MG 2.3    Liver Function Tests:  Recent Labs Lab 08/23/14 1154  AST 30  ALT 17  ALKPHOS 95  BILITOT 1.8*  PROT 6.3*  ALBUMIN 3.5   No results for input(s): LIPASE, AMYLASE in the last 168 hours. No results for input(s): AMMONIA in the last 168 hours.  CBC:  Recent Labs Lab 08/23/14 1154 08/24/14 0250  WBC 8.3 8.5  NEUTROABS 6.8 6.5  HGB 14.1 13.6  HCT 44.2 42.8  MCV 91.7 91.8  PLT 120* 122*    INR:  Recent Labs Lab 08/23/14 1154 08/24/14 0250  INR 3.17* 3.11*    Other results:  EKG:   Imaging: Dg Chest Portable 1 View  08/23/2014   CLINICAL DATA:  One month of productive cough. One day of weakness and dizziness.  EXAM: PORTABLE CHEST - 1 VIEW  COMPARISON:  September 05, 2013  FINDINGS: There is airspace consolidation in the left base.  Lungs elsewhere clear. Heart is mildly enlarged with pulmonary vascularity within normal limits. There is a left ventricular assist device present. Pacemaker leads are attached to the right atrium and right ventricle. No adenopathy.  IMPRESSION: Left lower lobe consolidation. Lungs elsewhere clear. Stable cardiac enlargement. Stable pacemaker lead positioning as well as left ventricular assist device present. Followup PA and lateral chest radiographs recommended in 3-4 weeks following trial of antibiotic therapy to ensure resolution and exclude underlying malignancy.   Electronically Signed   By: William  Woodruff III M.D.   On: 08/23/2014 14:40       Medications:     Scheduled Medications: . carvedilol  3.125 mg Oral BID WC  . ferrous sulfate  325 mg Oral BID WC  . furosemide  40 mg Oral BID  . insulin NPH Human  5 Units Subcutaneous QAC breakfast  . sodium chloride  3 mL Intravenous Q12H  . sodium chloride  3 mL Intravenous Q12H  . tamsulosin  0.4 mg Oral QPC breakfast     Infusions: . sodium chloride    . sodium chloride    . amiodarone 30 mg/hr (08/23/14 2342)     PRN Medications:  acetaminophen **OR** acetaminophen, sodium chloride, sodium chloride   Assessment:  1. CAD s/p CABG 2001 2. Ischemic cardiomyopathy with [employer] ICD.  HeartMate II LVAD placed [facility], [place]. Followed at [facility] at [phone], LVAD NP Pat, (cell:[phone]).  3. CKD 4. DM2 5. Atrial fibrillation: Current. 6. VT: Current.  7. Anticoagulation with INR goal 1.8-2 per patient's LVAD coordinator.  However, he has been running in the 2-3.5 range for 2 months.   Plan/Discussion:   Mr Julio came to the ER with increasing dizziness over the prior day. HR noted to be in the 140s with wide complex tachycardia. MAP preserved 80s-90s. Interrogation of his [employer] device has shown ventricular tachycardia with co-existing atrial fibrillation. EP saw patient, tried to pace him out of VT. Unable to terminate VT but HR decreased to 120s (slower VT). Patient now feels better with no dizziness but remains in VT in 120s.   Patient now on amiodarone gtt. He was on 200 mg daily amiodarone at home, he says this was for atrial fibrillation. He says that for the last 2 months, his INR has been around 3. It has never in that period been less than 2. Today it remains 3.1.Will plan DCCV today. Risk of CVA with co-existing atrial fibrillation that may be cardioverted, but should be safe to cardiovert in absence of TEE given therapeutic INR for at least 2 months. Will need to keep INR at  least 2-2.5 post-DCCV if he goes back into NSR.   Lisinopril held for now with creatinine elevation.  He is on his home Lasix dose.   I reviewed the LVAD parameters from today, and compared the results to the patient's prior recorded data.  No programming changes were made.  The LVAD is functioning within specified parameters.  The patient performs LVAD self-test daily.  LVAD interrogation was negative for any significant power changes, alarms or PI events/speed drops.  LVAD equipment check completed and is in good working order.  Back-up equipment present.   LVAD education done on emergency procedures and precautions and reviewed exit site care.  Length of Stay: 1  Elby Blackwelder 08/24/2014, 7:44 AM  VAD Team --- VAD ISSUES ONLY--- Pager [phone] (7am - 7am)  Advanced Heart Failure Team  Pager [phone] (M-F; 7a - 4p)    Please contact [facility] for night-coverage after hours (4p -7a ) and weekends on [email or web address]

## 2014-08-25 ENCOUNTER — Other Ambulatory Visit: Payer: Self-pay | Admitting: Physician Assistant

## 2014-08-25 ENCOUNTER — Encounter (HOSPITAL_COMMUNITY): Payer: Self-pay | Admitting: Physician Assistant

## 2014-08-25 DIAGNOSIS — I480 Paroxysmal atrial fibrillation: Secondary | ICD-10-CM

## 2014-08-25 LAB — CBC WITH DIFFERENTIAL/PLATELET
Basophils Absolute: 0 10*3/uL (ref 0.0–0.1)
Basophils Relative: 0 % (ref 0–1)
EOS PCT: 2 % (ref 0–5)
Eosinophils Absolute: 0.2 10*3/uL (ref 0.0–0.7)
HEMATOCRIT: 44.1 % (ref 39.0–52.0)
Hemoglobin: 13.9 g/dL (ref 13.0–17.0)
LYMPHS PCT: 9 % — AB (ref 12–46)
Lymphs Abs: 0.7 10*3/uL (ref 0.7–4.0)
MCH: 29 pg (ref 26.0–34.0)
MCHC: 31.5 g/dL (ref 30.0–36.0)
MCV: 92.1 fL (ref 78.0–100.0)
MONO ABS: 0.8 10*3/uL (ref 0.1–1.0)
Monocytes Relative: 10 % (ref 3–12)
Neutro Abs: 6.5 10*3/uL (ref 1.7–7.7)
Neutrophils Relative %: 79 % — ABNORMAL HIGH (ref 43–77)
Platelets: 122 10*3/uL — ABNORMAL LOW (ref 150–400)
RBC: 4.79 MIL/uL (ref 4.22–5.81)
RDW: 18.2 % — ABNORMAL HIGH (ref 11.5–15.5)
WBC: 8.3 10*3/uL (ref 4.0–10.5)

## 2014-08-25 LAB — PROTIME-INR
INR: 2.92 — AB (ref 0.00–1.49)
PROTHROMBIN TIME: 30 s — AB (ref 11.6–15.2)

## 2014-08-25 LAB — BASIC METABOLIC PANEL
Anion gap: 9 (ref 5–15)
BUN: 29 mg/dL — ABNORMAL HIGH (ref 6–20)
CHLORIDE: 102 mmol/L (ref 101–111)
CO2: 28 mmol/L (ref 22–32)
Calcium: 8 mg/dL — ABNORMAL LOW (ref 8.9–10.3)
Creatinine, Ser: 2.07 mg/dL — ABNORMAL HIGH (ref 0.61–1.24)
GFR calc Af Amer: 34 mL/min — ABNORMAL LOW (ref >60)
GFR, EST NON AFRICAN AMERICAN: 30 mL/min — AB (ref >60)
Glucose, Bld: 97 mg/dL (ref 65–99)
POTASSIUM: 3.7 mmol/L (ref 3.5–5.1)
Sodium: 139 mmol/L (ref 135–145)

## 2014-08-25 LAB — LACTATE DEHYDROGENASE: LDH: 312 U/L — ABNORMAL HIGH (ref 98–192)

## 2014-08-25 LAB — GLUCOSE, CAPILLARY: Glucose-Capillary: 143 mg/dL — ABNORMAL HIGH (ref 65–99)

## 2014-08-25 MED ORDER — AMLODIPINE BESYLATE 2.5 MG PO TABS: 2.5000 mg | ORAL_TABLET | Freq: Every day | ORAL | Status: AC

## 2014-08-25 MED ORDER — AMIODARONE HCL 200 MG PO TABS: 200.0000 mg | ORAL_TABLET | Freq: Two times a day (BID) | ORAL | Status: DC

## 2014-08-25 MED ORDER — AMIODARONE HCL 200 MG PO TABS
200.0000 mg | ORAL_TABLET | Freq: Two times a day (BID) | ORAL | Status: DC
  Filled 2014-08-25 (×2): qty 1

## 2014-08-25 MED ORDER — AMLODIPINE BESYLATE 2.5 MG PO TABS
2.5000 mg | ORAL_TABLET | Freq: Every day | ORAL | Status: DC
  Filled 2014-08-25: qty 1

## 2014-08-25 MED ORDER — POTASSIUM CHLORIDE ER 20 MEQ PO TBCR: EXTENDED_RELEASE_TABLET | ORAL | Status: DC

## 2014-08-25 MED ORDER — WARFARIN SODIUM 2 MG PO TABS: ORAL_TABLET | ORAL | Status: AC

## 2014-08-25 NOTE — Progress Notes (Signed)
Patient ID: William Huerta, male   DOB: [birth year], 74 y.o.   MRN: [id number] HeartMate 2 Rounding Note  Subjective:    William Huerta is a 74 y.o. 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   [facility]iness and weakness x 1 day. First noted night prior to admission. Denies dietary or fluid non compliance. Taking all medicines as directed, has not missed any.   HR noted to be in the 140s with wide complex tachycardia. MAP preserved 80s-90s. Interrogation of his [employer] device has shown ventricular tachycardia with co-existing atrial fibrillation. EP saw patient, tried to pace him out of VT. Unable to terminate VT but HR decreased to 120s (slower VT). He was then cardioverted back to NSR on 7/15 without complication.  William Huerta feels much better today.    LDH stable 312, INR 2.92  LVAD INTERROGATION:  HeartMate II LVAD:  Flow 4.0 liters/min, speed 8800, power 5.7, PI 5.5.  No PI events.   Objective:    Vital Signs:   Temp:  [97.3 F (36.3 C)-98.8 F (37.1 C)] 98 F (36.7 C) (07/16 0344) Pulse Rate:  [32-142] 70 (07/16 0600) Resp:  [11-98] 15 (07/16 0600) BP: (90-98)/(0) 98/0 mmHg (07/15 1245) SpO2:  [93 %-100 %] 99 % (07/16 0600) Weight:  [209 lb (94.802 kg)] 209 lb (94.802 kg) (07/16 0600)   Mean arterial Pressure 90s  Intake/Output:   Intake/Output Summary (Last 24 hours) at 08/25/14 0725 Last data filed at 08/25/14 0500  Gross per 24 hour  Intake 1752.3 ml  Output   1031 ml  Net  721.3 ml     Physical Exam: General:  Well appearing. No resp difficulty HEENT: normal Neck: supple. JVP 7 cm. Carotids 2+ bilat; no bruits. No lymphadenopathy or thryomegaly appreciated. Cor: Mechanical heart sounds with LVAD hum present. Lungs: clear Abdomen: soft, nontender, nondistended. No hepatosplenomegaly. No bruits or  masses. Good bowel sounds. Driveline: C/D/I; securement device intact and driveline incorporated Extremities: no cyanosis, clubbing, rash, edema Neuro: alert & orientedx3, cranial nerves grossly intact. moves all 4 extremities w/o difficulty. Affect pleasant  Telemetry: A-V sequential pacing  Labs: Basic Metabolic Panel:  Recent Labs Lab 08/23/14 1154 08/24/14 0250 08/25/14 0227  NA 141 138 139  K 4.2 3.9 3.7  CL 109 103 102  CO2 23 26 28   GLUCOSE 152* 99 97  BUN 32* 32* 29*  CREATININE 2.18* 2.18* 2.07*  CALCIUM 8.6* 8.1* 8.0*  MG 2.3  --   --     Liver Function Tests:  Recent Labs Lab 08/23/14 1154  AST 30  ALT 17  ALKPHOS 95  BILITOT 1.8*  PROT 6.3*  ALBUMIN 3.5   No results for input(s): LIPASE, AMYLASE in the last 168 hours. No results for input(s): AMMONIA in the last 168 hours.  CBC:  Recent Labs Lab 08/23/14 1154 08/24/14 0250 08/25/14 0227  WBC 8.3 8.5 8.3  NEUTROABS 6.8 6.5 6.5  HGB 14.1 13.6 13.9  HCT 44.2 42.8 44.1  MCV 91.7 91.8 92.1  PLT 120* 122* 122*    INR:  Recent Labs Lab 08/23/14 1154 08/24/14 0250 08/25/14 0227  INR 3.17* 3.11* 2.92*    Other results:  EKG:   Imaging: Dg Chest Portable 1 View  08/23/2014   CLINICAL DATA:  One month of productive cough. One day  of weakness and dizziness.  EXAM: PORTABLE CHEST - 1 VIEW  COMPARISON:  September 05, 2013  FINDINGS: There is airspace consolidation in the left base. Lungs elsewhere clear. Heart is mildly enlarged with pulmonary vascularity within normal limits. There is a left ventricular assist device present. Pacemaker leads are attached to the right atrium and right ventricle. No adenopathy.  IMPRESSION: Left lower lobe consolidation. Lungs elsewhere clear. Stable cardiac enlargement. Stable pacemaker lead positioning as well as left ventricular assist device present. Followup PA and lateral chest radiographs recommended in 3-4 weeks following trial of antibiotic therapy to ensure  resolution and exclude underlying malignancy.   Electronically Signed   By: Bretta Bang III M.D.   On: 08/23/2014 14:40     Medications:     Scheduled Medications: . amiodarone  200 mg Oral BID  . amLODipine  2.5 mg Oral Daily  . carvedilol  3.125 mg Oral BID WC  . ferrous sulfate  325 mg Oral BID WC  . furosemide  40 mg Oral BID  . insulin aspart  0-15 Units Subcutaneous TID WC  . insulin NPH Human  5 Units Subcutaneous QAC breakfast  . sodium chloride  3 mL Intravenous Q12H  . sodium chloride  3 mL Intravenous Q12H  . tamsulosin  0.4 mg Oral QPC breakfast  . Warfarin - Pharmacist Dosing Inpatient   Does not apply q1800    Infusions: . sodium chloride    . sodium chloride 5 mL (08/24/14 0931)    PRN Medications: acetaminophen **OR** acetaminophen, calcium carbonate, sodium chloride, sodium chloride   Assessment:  1. CAD s/p CABG 2001 2. Ischemic cardiomyopathy with [employer] ICD.  HeartMate II LVAD placed [place], [place]. Followed at [facility] at [phone], LVAD NP Pat, (cell:[phone]).  3. CKD 4. DM2 5. Atrial fibrillation: Cardioverted this admission, now a-v sequentially paced.  6. VT: Cardioverted this admission.  7. Anticoagulation with INR goal 1.8-2 per patient's LVAD coordinator.  However, he has been running in the 2-3.5 range for 2 months.   Plan/Discussion:   William Huerta came to the ER with increasing dizziness over the prior day. HR noted to be in the 140s with wide complex tachycardia. MAP preserved 80s-90s. Interrogation of his [employer] device has shown ventricular tachycardia with co-existing atrial fibrillation. EP saw patient, tried to pace him out of VT. Unable to terminate VT but HR decreased to 120s (slower VT). Patient was cardioverted to NSR on 7/15 and remains in NSR.  Will need to keep INR at least 2-2.5 post-DCCV for at least a month (prior goal had been 1.8-2. Will increase  amiodarone to 200 mg bid.   Lisinopril held for now with creatinine elevation, amlodipine begun.  He is on his home Lasix dose.   Patient will need to walk unit this morning.  If he does ok, may go home.   Discharge plan: He will be in  until mid-August. Will need followup in the CHF clinic in 7-10 days then resume regular followup in [place].  He will need INR check early next week (has it done at a local lab and faxed to [place]).  Goal INR will be 2-2.5 for now.  Meds: warfarin (goal INR 2-2.5), amiodarone 200 mg bid, amlodipine 2.5 mg daily, Lasix 40 mg bid, KCl 20 daily, Coreg 3.125 mg bid, non-cardiac home meds as prior to admission.   I reviewed the LVAD parameters from today, and compared the results to the patient's prior recorded  data.  No programming changes were made.  The LVAD is functioning within specified parameters.  The patient performs LVAD self-test daily.  LVAD interrogation was negative for any significant power changes, alarms or PI events/speed drops.  LVAD equipment check completed and is in good working order.  Back-up equipment present.   LVAD education done on emergency procedures and precautions and reviewed exit site care.  Length of Stay: 2  Marca Ancona 08/25/2014, 7:25 AM  VAD Team --- VAD ISSUES ONLY--- Pager [phone] (7am - 7am)  Advanced Heart Failure Team  Pager [phone] (M-F; 7a - 4p)  Please contact [facility] for night-coverage after hours (4p -7a ) and weekends on [email or web address]

## 2014-08-25 NOTE — Progress Notes (Signed)
Pt discharge teaching completed using teachback method. Pt and wife educated on all med changes and verbalized understanding using teachback method.pt understood Coumadin changes and verbalized instructions. IV removed. VAD equipment checked. Pt was discharged on 2 full batteries. Pt had backup system controller and back up batteries in his bag. Pt was taken to the pickup area by the nurse tech.

## 2014-08-25 NOTE — Discharge Summary (Signed)
Discharge Summary   Patient ID: William Huerta MRN: [id number], DOB/AGE: February 19, 1940 74 y.o. Admit date: 08/23/2014 D/C date:     08/25/2014  Primary Care Provider: No PCP Per Patient Primary Cardiologist: [facility] at [phone]   Primary Discharge Diagnoses:  1. Ventricular tachycardia with co-existing atrial fibrillation 2. CAD s/p CABG 2001 3. Ischemic cardiomyopathy with [employer] ICD. HeartMate II LVAD placed [place], [place]. Followed at [facility] at [phone], LVAD NP Pat, (cell:[phone]).  4. CKD stage IV 5. DM2 6. Paroxysmal atrial fibrillation 7. Chronic anticoagulation 8. H/o anemia  Hospital Course: 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 He lives in [place] but visits in [place]. He presented to [facility] with dizziness and weakness for 1 day. He reported med, dietary, and fluid restriction compliance. LVAD was functioning normally, few PI events. HR was noted to be in the 140s with wide-complex tachycardia, but preserve MAP 80s-90s. Interrogation of his [employer] device showed ventricular tachycardia with co-existing atrial fibrillation. EP saw the patient and tried to pace him out of VT.They were unable to terminate VT but HR decreased to 120s (slower VT) with improvement in symptoms. He was placed on amiodarone bolus and drip. Lisinopril was held due to some creatinine elevation and amlodipine was started. The patient reported that for the last 2 months his INR had been around 3 and had never in that period been less than 2. He subsequently underwent DCCV to NSR on 08/24/14 and tolerated the procedure well. Today he is feeling much better. He will need to keep INR at least 2-2.5 post-DCCV for at least a month (prior goal had been  1.8-2.Amiodarone was transitioned back to oral form and home dose was increased to 200mg  BID. He remains on his home Lasix dose. Potassium was added due to KCl daily. May benefit from BMET at f/u visit in CHF clinic to ensure stability.  As far as discharge plan goes, he will be in [place] until mid-August. He need followup in the CHF clinic in 7-10 days then resume regular followup in [place]. I sent a message to our CHF clinic to help arrange the follow-up. The patient [facility] with results being sent to Zenon Mayo at [facility] [phone]. I have given him the lab slip for this to take on Monday to the lab. I also told him to please remind Cordelia Pen that his new INR goal is 2-2.5. We will also fax a copy of this discharge summary to [facility]. I discussed with pharmacy what dose of coumadin he should be on and they recommended Coumadin 2mg  daily for now, INR check Monday as planned. New rx sent in. (previous dose 2.5mg  every day except 5mg  Tues/Thurs - but now requiring lower dose per pharmacy due to elevated INR during this admission and increase in amiodarone.) This was discussed with the patient and his wife. Dr.McLean has seen and examined the patient today and feels he is stable for discharge.  Of note, CXR this admission showed LLL consolidation but he did not have signs of PNA - radiology is recommending f/u CXR in 3-4 weeks to ensure resolution and exclude underlying malignancy.   Discharge Vitals: Blood pressure 98/0, pulse 70, temperature 98.2 F (36.8 C),  temperature source Oral, resp. rate 15, height 5\' 6"  (1.676 m), weight 209 lb (94.802 kg), SpO2 96 %.  Labs: Lab Results  Component Value Date   WBC 8.3 08/25/2014   HGB 13.9 08/25/2014   HCT 44.1 08/25/2014   MCV 92.1 08/25/2014   PLT 122* 08/25/2014    Recent Labs Lab 08/23/14 1154  08/25/14 0227  NA 141  < > 139  K 4.2  < > 3.7  CL 109  < > 102  CO2 23  < > 28  BUN 32*  < >  29*  CREATININE 2.18*  < > 2.07*  CALCIUM 8.6*  < > 8.0*  PROT 6.3*  --   --   BILITOT 1.8*  --   --   ALKPHOS 95  --   --   ALT 17  --   --   AST 30  --   --   GLUCOSE 152*  < > 97  < > = values in this interval not displayed.   Diagnostic Studies/Procedures   Dg Chest Portable 1 View  08/23/2014   CLINICAL DATA:  One month of productive cough. One day of weakness and dizziness.  EXAM: PORTABLE CHEST - 1 VIEW  COMPARISON:  September 05, 2013  FINDINGS: There is airspace consolidation in the left base. Lungs elsewhere clear. Heart is mildly enlarged with pulmonary vascularity within normal limits. There is a left ventricular assist device present. Pacemaker leads are attached to the right atrium and right ventricle. No adenopathy.  IMPRESSION: Left lower lobe consolidation. Lungs elsewhere clear. Stable cardiac enlargement. Stable pacemaker lead positioning as well as left ventricular assist device present. Followup PA and lateral chest radiographs recommended in 3-4 weeks following trial of antibiotic therapy to ensure resolution and exclude underlying malignancy.   Electronically Signed   By: Bretta BangWilliam  Woodruff III M.D.   On: 08/23/2014 14:40   DCCV 08/24/14  Discharge Medications   Current Discharge Medication List    START taking these medications   Details  amLODipine (NORVASC) 2.5 MG tablet Take 1 tablet (2.5 mg total) by mouth daily. Qty: 30 tablet, Refills: 1    potassium chloride 20 MEQ TBCR Take 1 tablet (20mEq) by mouth daily. Qty: 30 tablet, Refills: 1      CONTINUE these medications which have CHANGED   Details  amiodarone (PACERONE) 200 MG tablet Take 1 tablet (200 mg total) by mouth 2 (two) times daily. Qty: 60 tablet, Refills: 1    warfarin (COUMADIN) 2 MG tablet Take 1 tablet (2mg ) by mouth daily or as directed by Coumadin clinic. Qty: 30 tablet, Refills: 1      CONTINUE these medications which have NOT CHANGED   Details  acetaminophen (TYLENOL) 500 MG tablet Take  1,000 mg by mouth every 8 (eight) hours as needed for moderate pain.     carvedilol (COREG) 3.125 MG tablet Take 3.125 mg by mouth 2 (two) times daily with a meal.    ferrous sulfate 325 (65 FE) MG tablet Take 325 mg by mouth 2 (two) times daily with a meal.     furosemide (LASIX) 40 MG tablet Take 40 mg by mouth 2 (two) times daily.    HUMULIN N KWIKPEN 100 UNIT/ML Kiwkpen Inject 5 Units into the skin daily before breakfast.    Multiple Vitamin (MULTIVITAMIN) capsule Take 1 capsule by mouth daily.    tamsulosin (FLOMAX) 0.4 MG CAPS capsule Take 0.4 mg by mouth daily after supper.  STOP taking these medications     lisinopril (PRINIVIL,ZESTRIL) 5 MG tablet            Disposition   The patient will be discharged in stable condition to home. Discharge Instructions    Diet - low sodium heart healthy    Complete by:  As directed      Increase activity slowly    Complete by:  As directed   Your lisinopril was stopped. Amlodipine and potassium are new medicines. Amiodarone was increased. Your Coumadin dose has been adjusted - you will take  daily for now (today, tomorrow). Please have INR checked on Monday morning to determine if your dose needs further adjusting. This is a new prescription.  YOUR NEW INR GOAL FOR AT LEAST 1 MONTH AFTER CARDIOVERSION IS 2.0-2.5.          Follow-up Information    Follow up with Marca Ancona, MD.   Specialty:  Cardiology   Why:  Office will call you for your followup appointment. Call office if you have not heard back in 3 days.   Contact information:   [address] [place] [address] [phone]       Follow up with [facility].   Why:  Please take lab slip for INR to your usual lab on Monday morning for follow-up INR level. Note that your new goal INR is 2-2.5 for at least one month post-cardioversion.        Duration of Discharge Encounter: Greater than 30 minutes including physician and PA  time.  Thomasene Mohair PA-C 08/25/2014, 8:34 AM

## 2014-08-27 ENCOUNTER — Encounter (HOSPITAL_COMMUNITY): Payer: Self-pay | Admitting: Cardiology

## 2014-08-27 ENCOUNTER — Other Ambulatory Visit: Payer: Self-pay | Admitting: Physician Assistant

## 2014-08-27 LAB — GLUCOSE, CAPILLARY: Glucose-Capillary: 89 mg/dL (ref 65–99)

## 2014-08-27 LAB — PROTIME-INR
INR: 2.44 — ABNORMAL HIGH (ref <1.50)
PROTHROMBIN TIME: 26.5 s — AB (ref 11.6–15.2)

## 2014-08-28 ENCOUNTER — Telehealth: Payer: Self-pay | Admitting: Cardiology

## 2014-08-28 ENCOUNTER — Telehealth (HOSPITAL_COMMUNITY): Payer: Self-pay | Admitting: *Deleted

## 2014-08-28 NOTE — Telephone Encounter (Signed)
Wife/patient called asking about INR results and coumadin dosing instructions. Stated that [facility] has not received [facility] results from yesterday.  I informed pt that I had personally spoken with Zenon MayoSherry Lee at [facility] earlier today and gave her verbal INR result of 2.44 and faxed copy to her as well (confirmation fax copy retained). She had tried calling patient with no answer, but stated she "will call him later with coumadin instructions and f/u plan".  Pt has Sherry's personal cell # if he has any questions.   Cordelia PenSherry did verbalize she received copy of discharge summary that I faxed to her yesterday; acknowledged new INR goal 2 - 2.5, increased amiodarone dose, and decreased coumadin dose of 2 mg daily. She does not plan to change coumadin dose at this time and will discuss with patient.

## 2014-08-28 NOTE — Telephone Encounter (Signed)
Called VAD Coordinator at implanting center [facility]) to inform about INR results and requested to call patient with further coumadin instructions. Updated over the phone and clinic notes previously faxed detailing recent hospital encounter for VT, increased anticoagulation goal and Amiodarone therapy. She reports to have attempted calling him on home phone recently.

## 2014-08-28 NOTE — Telephone Encounter (Signed)
New message      Pt is calling stating he has not received a call from [facility] regarding his INR.  Please call to let him know how to take his coumadin.  Called coumadin clinic and they said msg need to be sent to a nurse or Dayna.

## 2014-09-13 ENCOUNTER — Other Ambulatory Visit (HOSPITAL_COMMUNITY): Payer: Self-pay | Admitting: *Deleted

## 2014-09-13 ENCOUNTER — Ambulatory Visit (HOSPITAL_COMMUNITY)
Admission: RE | Admit: 2014-09-13 | Discharge: 2014-09-13 | Disposition: A | Discharge status: Home / Self Care | Payer: Medicare Other | Source: Ambulatory Visit | Attending: Cardiology | Admitting: Cardiology

## 2014-09-13 ENCOUNTER — Encounter (HOSPITAL_COMMUNITY): Payer: Medicare Other

## 2014-09-13 VITALS — BP 86/0 | HR 127 | Wt 211.2 lb

## 2014-09-13 DIAGNOSIS — I472 Ventricular tachycardia, unspecified: Secondary | ICD-10-CM

## 2014-09-13 DIAGNOSIS — Z951 Presence of aortocoronary bypass graft: Secondary | ICD-10-CM | POA: Insufficient documentation

## 2014-09-13 DIAGNOSIS — I5022 Chronic systolic (congestive) heart failure: Secondary | ICD-10-CM | POA: Diagnosis not present

## 2014-09-13 DIAGNOSIS — I2510 Atherosclerotic heart disease of native coronary artery without angina pectoris: Secondary | ICD-10-CM | POA: Diagnosis not present

## 2014-09-13 DIAGNOSIS — Z95811 Presence of heart assist device: Secondary | ICD-10-CM | POA: Diagnosis not present

## 2014-09-13 DIAGNOSIS — N189 Chronic kidney disease, unspecified: Secondary | ICD-10-CM | POA: Insufficient documentation

## 2014-09-13 DIAGNOSIS — E1122 Type 2 diabetes mellitus with diabetic chronic kidney disease: Secondary | ICD-10-CM | POA: Insufficient documentation

## 2014-09-13 DIAGNOSIS — Z48812 Encounter for surgical aftercare following surgery on the circulatory system: Secondary | ICD-10-CM | POA: Diagnosis present

## 2014-09-13 DIAGNOSIS — I255 Ischemic cardiomyopathy: Secondary | ICD-10-CM | POA: Diagnosis not present

## 2014-09-13 DIAGNOSIS — Z79899 Other long term (current) drug therapy: Secondary | ICD-10-CM | POA: Insufficient documentation

## 2014-09-13 DIAGNOSIS — Z7901 Long term (current) use of anticoagulants: Secondary | ICD-10-CM

## 2014-09-13 DIAGNOSIS — I4891 Unspecified atrial fibrillation: Secondary | ICD-10-CM | POA: Diagnosis not present

## 2014-09-13 LAB — CBC
HEMATOCRIT: 45.8 % (ref 39.0–52.0)
HEMOGLOBIN: 14.8 g/dL (ref 13.0–17.0)
MCH: 29.8 pg (ref 26.0–34.0)
MCHC: 32.3 g/dL (ref 30.0–36.0)
MCV: 92.2 fL (ref 78.0–100.0)
Platelets: 130 10*3/uL — ABNORMAL LOW (ref 150–400)
RBC: 4.97 MIL/uL (ref 4.22–5.81)
RDW: 17.4 % — ABNORMAL HIGH (ref 11.5–15.5)
WBC: 7.3 10*3/uL (ref 4.0–10.5)

## 2014-09-13 LAB — BASIC METABOLIC PANEL
Anion gap: 10 (ref 5–15)
BUN: 31 mg/dL — ABNORMAL HIGH (ref 6–20)
CALCIUM: 8.7 mg/dL — AB (ref 8.9–10.3)
CO2: 24 mmol/L (ref 22–32)
Chloride: 104 mmol/L (ref 101–111)
Creatinine, Ser: 1.87 mg/dL — ABNORMAL HIGH (ref 0.61–1.24)
GFR, EST AFRICAN AMERICAN: 39 mL/min — AB (ref >60)
GFR, EST NON AFRICAN AMERICAN: 34 mL/min — AB (ref >60)
GLUCOSE: 153 mg/dL — AB (ref 65–99)
Potassium: 3.8 mmol/L (ref 3.5–5.1)
SODIUM: 138 mmol/L (ref 135–145)

## 2014-09-13 LAB — PROTIME-INR
INR: 1.56 — AB (ref 0.00–1.49)
PROTHROMBIN TIME: 18.7 s — AB (ref 11.6–15.2)

## 2014-09-13 LAB — LACTATE DEHYDROGENASE: LDH: 330 U/L — ABNORMAL HIGH (ref 98–192)

## 2014-09-13 MED ORDER — RANOLAZINE ER 500 MG PO TB12: 500.0000 mg | ORAL_TABLET | Freq: Two times a day (BID) | ORAL | Status: DC

## 2014-09-13 MED ORDER — AMIODARONE HCL 200 MG PO TABS: 200.0000 mg | ORAL_TABLET | Freq: Two times a day (BID) | ORAL | Status: DC

## 2014-09-13 NOTE — Patient Instructions (Addendum)
INCREASE Amiodarone 400 mg (2 tabs) twice daily x 2 weeks, then reduce back to 200 mg (1 tab) twice daily.  START Ranexa  tablet twice daily.

## 2014-09-13 NOTE — Progress Notes (Addendum)
  Vital signs: HR:  127  Doppler MAP:  86 Auto cuff:  96/81 (91) SPO2:  97% Weight:  211.2 lbs   VAD interrogation revealed: Speed:   8800 Flow:   4.0 Power:   5.1 PI: 4.1 Alarms:  none Events:  0 - 5 PI events Fixed speed:  8800 Low speed limit: 8400   Pt presented to VAD clinic for hospital discharge f/u.  Pt reports he had INR checked Friday 09/07/14 and was 1.49 - this is managed by [facility]. Pt states he was instructed to take 5 mg that day, then increase daily dose to 2.5 mg five days a week and 5 mg two days a week.  INR today is 1.56.  Presents with elevated heart rate on monitor, full interrogation of [employer] ICD obtained and reviewed with rep by Dr. Gala Romney. Pt was found to be in slow VT which was pace terminated.    Pt was instructed to increase Amiodarone to 400 mg bid for two weeks, then decrease to 200 mg twice daily.  He was instructed to start Ranexa 500 mg twice daily. He is returning home 09/19/14 and will f/u with [facility].  Updated Georganna Skeans, VAD coordinator and Philis Pique, NP at [facility], [place].  Faxed updated notes and ICD report.  Sherri (who is on vacation) recommended coumadin dose adjustment with INR check Monday 09/17/14.  I relayed above information including increased amiodarone dose to Ms. Michaell Cowing. She will contact patient and continue to manage INR/coumadin dosing and plan on seeing patient upon return to [place].

## 2014-09-13 NOTE — Progress Notes (Addendum)
VAD CLINIC OUTPATIENT NOTE  PCP: [facility]   HPI:  William Huerta is a 74 y.o. 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   Presented to [facility] in July with dizziness and weakness. HR noted to be in the 140s with wide complex tachycardia. MAP preserved 80s-90s. Interrogation of his [employer] device has shown ventricular tachycardia with co-existing atrial fibrillation. EP saw patient, tried to pace him out of VT. Unable to terminate VT but HR decreased to 120s (slower VT). Then underwent DC-CV on 08/24/14. Amio increased from 100 bid to 200 bid.   Follow up for Heart Failure/LVAD: Presents to clinic today. Feels well. Denies CP, SOB, palpitations or edema. ECG shows WCT at 120. ICD interrogated personally with device rep confirms recurrent VT. We easily paced him out.   Denies LVAD alarms.  Denies driveline trauma, erythema or drainage.  Denies ICD shocks.   Reports taking Coumadin as prescribed and adherence to anticoagulation based dietary restrictions.  Denies bright red blood per rectum or melena, no dark urine or hematuria.     Past Medical History  Diagnosis Date  . Heart attack 04/1980  . History of quadruple bypass 07/1999    Quad Bypass, mitral valve repair  . Presence of combination internal cardiac defibrillator (ICD) and pacemaker 07/1999    Guidant #103003  . S/P ablation of atrial fibrillation 05/2002  . History of cardioversion 12/2003  . Blood infection 05/2007  . Anemia 06/2007  . Chronic systolic CHF (congestive heart failure)     a. s/p HeartMate II LVAD 6.5 years at [facility] in [place].  . LVAD (left ventricular assist device) present   . CAD (coronary artery disease)     a. s/p Quad bypass 07/1999.  . S/P ICD (internal cardiac defibrillator) procedure     a. s/p [employer] ICD 07/1999.  .  Diabetes mellitus   . CKD (chronic kidney disease), stage IV   . PAF (paroxysmal atrial fibrillation)     a. Recurrence 08/2013 with VT/co-existing atrial fib s/p DCCV 08/24/14.  . Ventricular tachycardia     a. Noted on interrogation 08/2013 with co-existing AF.    Current Outpatient Prescriptions  Medication Sig Dispense Refill  . acetaminophen (TYLENOL) 500 MG tablet Take 1,000 mg by mouth every 8 (eight) hours as needed for moderate pain.     Marland Kitchen amLODipine (NORVASC) 2.5 MG tablet Take 1 tablet (2.5 mg total) by mouth daily. 30 tablet 1  . carvedilol (COREG) 3.125 MG tablet Take 3.125 mg by mouth 2 (two) times daily with a meal.    . ferrous sulfate 325 (65 FE) MG tablet Take 325 mg by mouth 2 (two) times daily with a meal.     . furosemide (LASIX) 40 MG tablet Take 40 mg by mouth 2 (two) times daily.    Marland Kitchen HUMULIN N KWIKPEN 100 UNIT/ML Kiwkpen Inject 5 Units into the skin daily before breakfast.    . Multiple Vitamin (MULTIVITAMIN) capsule Take 1 capsule by mouth daily.    . potassium chloride 20 MEQ TBCR Take 1 tablet ( ) by mouth daily. 30 tablet 1  . tamsulosin (FLOMAX) 0.4 MG CAPS capsule Take 0.4 mg by mouth daily after supper.     . warfarin (COUMADIN) 2 MG tablet Take 1 tablet ( ) by mouth daily or as directed by Coumadin  clinic. (Patient not taking: Reported on 09/16/2014) 30 tablet 1  . amiodarone (PACERONE) 200 MG tablet Take 200 mg bid 84 tablet 0  . ranolazine (RANEXA) 500 MG 12 hr tablet Take 1 tablet (500 mg total) by mouth 2 (two) times daily. 60 tablet 6  . warfarin (COUMADIN) 5 MG tablet Take 2.5-5 mg by mouth See admin instructions. W-TH-F take 1 tablet SAT-SUN take 1/2 tablet     No current facility-administered medications for this encounter.    Review of patient's allergies indicates no known allergies.  REVIEW OF SYSTEMS: All systems negative except as listed in HPI, PMH and Problem list.   VAD interrogation revealed:  Speed: 8800  Flow: 4.0  Power: 5.1   PI: 4.1  Alarms: none  Events: 0 - 5 PI events Fixed speed: 8800  Low speed limit: 8400   I reviewed the LVAD parameters from today, and compared the results to the patient's prior recorded data.  No programming changes were made.  The LVAD is functioning within specified parameters.  The patient performs LVAD self-test daily.  LVAD interrogation was negative for any significant power changes, alarms or PI events/speed drops.  LVAD equipment check completed and is in good working order.  Back-up equipment present.   LVAD education done on emergency procedures and precautions and reviewed exit site care.    Filed Vitals:   09/13/14 1502  BP: 86/0  Pulse: 127  Weight: 211 lb 3.2 oz (95.8 kg)  SpO2: 97%    Physical Exam: GENERAL: Well appearing, male who presents to clinic today in no acute distress. HEENT: normal  NECK: Supple, JVP 7-8  .  2+ bilaterally, no bruits.  No lymphadenopathy or thyromegaly appreciated.   CARDIAC:  Mechanical heart sounds with LVAD hum present.  LUNGS:  Clear to auscultation bilaterally.  ABDOMEN:  Soft, obese, round, nontender, positive bowel sounds x4.     LVAD exit site: well-healed and incorporated.  Dressing dry and intact.  No erythema or drainage.  Stabilization device present and accurately applied.  Driveline dressing is being changed daily per sterile technique. EXTREMITIES:  Warm and dry, no cyanosis, clubbing, rash or edema  NEUROLOGIC:  Alert and oriented x 4.  Gait steady.  No aphasia.  No dysarthria.  Affect pleasant.     ASSESSMENT AND PLAN:   1. Chronic systolic HF s/p HM-II LVAD -NYHA I symptoms. Volume status looks good 2. Recurrent VT -Rate slower on amio so asymptomatic.  Paced out in clinic today. Will increase amio to 400 bid x 2 weeks. Add Ranexa for synergy in decreasing VT recurrence. Discussed with EP who agrees.  3. HM II- VAD -Driveline site looks good. VAD interrogation done personally and is O 4.  Anticoagulation  management - INR goal 2.0-3.0.  Will evaluate INR value today and follow up with patient for necessary changes via his primary cardiology team in [place].  5. HTN  -BP well controlled 6. CAD - stable. No sx of ischemia.    Elison Worrel,MD 11:23 PM

## 2014-09-14 ENCOUNTER — Other Ambulatory Visit (HOSPITAL_COMMUNITY): Payer: Self-pay | Admitting: *Deleted

## 2014-09-14 DIAGNOSIS — I472 Ventricular tachycardia, unspecified: Secondary | ICD-10-CM

## 2014-09-14 MED ORDER — AMIODARONE HCL 200 MG PO TABS: ORAL_TABLET | ORAL | Status: DC

## 2014-09-16 ENCOUNTER — Encounter (HOSPITAL_COMMUNITY): Payer: Self-pay | Admitting: *Deleted

## 2014-09-16 ENCOUNTER — Emergency Department (HOSPITAL_COMMUNITY)
Admission: EM | Admit: 2014-09-16 | Discharge: 2014-09-16 | Disposition: A | Discharge status: Home / Self Care | Payer: Medicare Other | Attending: Emergency Medicine | Admitting: Emergency Medicine

## 2014-09-16 DIAGNOSIS — E119 Type 2 diabetes mellitus without complications: Secondary | ICD-10-CM | POA: Diagnosis not present

## 2014-09-16 DIAGNOSIS — Z95811 Presence of heart assist device: Secondary | ICD-10-CM | POA: Diagnosis not present

## 2014-09-16 DIAGNOSIS — I5022 Chronic systolic (congestive) heart failure: Secondary | ICD-10-CM | POA: Diagnosis not present

## 2014-09-16 DIAGNOSIS — Z8674 Personal history of sudden cardiac arrest: Secondary | ICD-10-CM | POA: Insufficient documentation

## 2014-09-16 DIAGNOSIS — Z794 Long term (current) use of insulin: Secondary | ICD-10-CM | POA: Diagnosis not present

## 2014-09-16 DIAGNOSIS — T462X5A Adverse effect of other antidysrhythmic drugs, initial encounter: Secondary | ICD-10-CM

## 2014-09-16 DIAGNOSIS — N184 Chronic kidney disease, stage 4 (severe): Secondary | ICD-10-CM | POA: Diagnosis not present

## 2014-09-16 DIAGNOSIS — Z95810 Presence of automatic (implantable) cardiac defibrillator: Secondary | ICD-10-CM | POA: Diagnosis not present

## 2014-09-16 DIAGNOSIS — I472 Ventricular tachycardia, unspecified: Secondary | ICD-10-CM

## 2014-09-16 DIAGNOSIS — I2510 Atherosclerotic heart disease of native coronary artery without angina pectoris: Secondary | ICD-10-CM | POA: Diagnosis not present

## 2014-09-16 DIAGNOSIS — Z79899 Other long term (current) drug therapy: Secondary | ICD-10-CM | POA: Diagnosis not present

## 2014-09-16 DIAGNOSIS — D649 Anemia, unspecified: Secondary | ICD-10-CM | POA: Diagnosis not present

## 2014-09-16 DIAGNOSIS — R531 Weakness: Secondary | ICD-10-CM | POA: Diagnosis present

## 2014-09-16 LAB — CBC WITH DIFFERENTIAL/PLATELET
BASOS ABS: 0 10*3/uL (ref 0.0–0.1)
Basophils Relative: 1 % (ref 0–1)
Eosinophils Absolute: 0.1 10*3/uL (ref 0.0–0.7)
Eosinophils Relative: 2 % (ref 0–5)
HCT: 44 % (ref 39.0–52.0)
Hemoglobin: 14.1 g/dL (ref 13.0–17.0)
LYMPHS PCT: 10 % — AB (ref 12–46)
Lymphs Abs: 0.6 10*3/uL — ABNORMAL LOW (ref 0.7–4.0)
MCH: 29.2 pg (ref 26.0–34.0)
MCHC: 32 g/dL (ref 30.0–36.0)
MCV: 91.1 fL (ref 78.0–100.0)
MONO ABS: 0.6 10*3/uL (ref 0.1–1.0)
Monocytes Relative: 9 % (ref 3–12)
NEUTROS PCT: 78 % — AB (ref 43–77)
Neutro Abs: 4.7 10*3/uL (ref 1.7–7.7)
Platelets: 128 10*3/uL — ABNORMAL LOW (ref 150–400)
RBC: 4.83 MIL/uL (ref 4.22–5.81)
RDW: 17.4 % — ABNORMAL HIGH (ref 11.5–15.5)
WBC: 6 10*3/uL (ref 4.0–10.5)

## 2014-09-16 LAB — BASIC METABOLIC PANEL
Anion gap: 9 (ref 5–15)
BUN: 33 mg/dL — AB (ref 6–20)
CO2: 28 mmol/L (ref 22–32)
Calcium: 8.8 mg/dL — ABNORMAL LOW (ref 8.9–10.3)
Chloride: 103 mmol/L (ref 101–111)
Creatinine, Ser: 2.02 mg/dL — ABNORMAL HIGH (ref 0.61–1.24)
GFR calc Af Amer: 35 mL/min — ABNORMAL LOW (ref >60)
GFR, EST NON AFRICAN AMERICAN: 31 mL/min — AB (ref >60)
Glucose, Bld: 201 mg/dL — ABNORMAL HIGH (ref 65–99)
Potassium: 4 mmol/L (ref 3.5–5.1)
SODIUM: 140 mmol/L (ref 135–145)

## 2014-09-16 LAB — PROTIME-INR
INR: 2.03 — AB (ref 0.00–1.49)
Prothrombin Time: 22.8 seconds — ABNORMAL HIGH (ref 11.6–15.2)

## 2014-09-16 MED ORDER — AMIODARONE HCL 200 MG PO TABS: ORAL_TABLET | ORAL | Status: DC

## 2014-09-16 NOTE — Discharge Instructions (Signed)
Ventricular Assist Device A ventricular assist device (VAD) is a mechanical pump that helps the two lower chambers of your heart (ventricles) pump blood. A person may need a VAD due to:  Heart failure.  A heart attack (myocardial infarction, MI).  An infection or medical condition that affects the heart muscle. PURPOSES OF A VAD  A bridge to recovery. A VAD can provide support until your heart function improves sufficiently. If heart function improves, the VAD can be removed.  A bridge to transplantation. A VAD can provide support until a heart transplant becomes available.  A bridge to destination therapy. A VAD can provide support to individuals who have end-stage heart failure and are not candidates for a heart transplant. TYPES OF VADS There are different types of VADs. Most VADs are implanted into your heart. Your caregiver will determine which type of device you need based on your type of heart failure and medical condition. Types of VADs include:  Left Ventricular Assist Device (LVAD). This is the most common type of VAD. It helps the left ventricle pump blood to the aorta.  Right Ventricular Assist Device (RVAD). This type of VAD helps pump blood from the right ventricle to the lungs. An RVAD is used if you have right-sided heart failure. It may only be used for a short time.  Biventricular Assist Device (BIVAD). This type of VAD combines an LVAD and an RVAD. The two are used together to help the right and left ventricle pump blood.  Transcutaneous VAD (Bedside VAD). This type of VAD is used in emergency or short-term situations. The pump is located outside the body (transcutaneous). PARTS OF A VAD A VAD consists of the following parts:  Inflow tube. This tube carries oxygenated blood from the left ventricle to the VAD pump.  Outflow tube. This tube carries blood from the VAD pump to the aorta. The aorta is a large artery that carries oxygenated blood from your heart to the rest  of your body.  Pump. The VAD pump is implanted inside the body.  Drive line (percutaneous lead). This tube connects the VAD pump to the system controller.  System controller. The controller consists of batteries and electronics which control the VAD. The VAD is powered by either batteries or an electrical wall outlet. BLOOD FLOW THROUGH A VAD  Blood flows through a VAD in one of two methods.  Pulse flow. Blood is pumped or pulses through the VAD.  Continuous flow. Blood is continuously cycled through the VAD. You may not feel a pulse when your blood is continuously ran through the VAD. RISKS AND COMPLICATIONS OF A VAD With any type of VAD, problems can occur during implantation, after implantation, or when living at home with the VAD. Complications can include:  Infection.  Bacteria can get into the VAD tubing and cause an infection.  After implantation of a VAD, an infection can also affect different body organs such as the lungs and kidneys.  You may be on antibiotic medicine.  Bleeding.  You will need to take blood thinning medicines (anticoagulants) to prevent blood clots in the VAD tubing. Bleeding can occur because of the anticoagulant use.  You will need frequent blood samples to measure the clotting time of your blood.  Blood clots.  Even with anticoagulants, there is still a risk for blood clots.  Blood clots can clog the VAD and prevent the VAD from working properly.  Stroke.  A stroke caused by a clot (ischemic stroke).  A stroke  caused by bleeding into the brain (hemorrhagic stroke). This type of stroke can be caused by anticoagulant use.  VAD malfunction.  Mechanical complications can occur with a VAD. It is important to understand the VAD functions and alarms. IMPLANTING A VAD  Open heart surgery is needed to implant a VAD.  After the VAD is implanted, you will go to an intensive care unit (ICU). Expect the following:  You will be on a breathing  machine (ventilator). You will be weaned off the ventilator when you are strong enough to breathe on your own.  You will have a lot of tubes after the surgery. A tube will drain urine from your bladder (catheter). A tube in your chest (chest tube) will drain fluid from around your heart. Intravenous (IV) lines will allow for the administration of fluids and medicines.  Pain relieving medicine will be given to keep you comfortable. VAD SUPPORT The care and support you will need with a VAD will vary. This depends on the type of VAD you have and your medical condition. You can expect the following:  You may need a continuous hospital stay with a VAD.  You will receive support from different health care professionals such as:  A heart specialist (cardiologist).  A VAD coordinator.  A dietitian.  A Child psychotherapist.  You must be able to live independently if discharged home with a VAD.  If you are discharged home with a VAD, it is important to live near the hospital where the VAD was implanted. Individuals who are discharged home with a VAD will need close follow-up care and support. Document Released: 07/16/2009 Document Revised: 06/12/2013 Document Reviewed: 07/16/2009 [facility] Patient Information 2015 [place], [place]. This information is not intended to replace advice given to you by your health care provider. Make sure you discuss any questions you have with your health care provider.

## 2014-09-16 NOTE — Consult Note (Signed)
VAD TEAM ER CONSULT Note    HPI:     William Huerta is a 74 y.o. 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   Presented to [facility] in July with dizziness and weakness.  HR noted to be in the 140s with wide complex tachycardia. MAP preserved 80s-90s. Interrogation of his [employer] device has shown ventricular tachycardia with co-existing atrial fibrillation. EP saw patient, tried to pace him out of VT. Unable to terminate VT but HR decreased to 120s (slower VT). Then underwent DC-CV on 08/24/14. Amio increased from 100 bid to 200 bid.   I saw him in clinic on Thursday for routine f/u and was again in VT but this time asx as rate was slower in 120s. We easily paced him out. Amio increased to 400 bid x 2 weeks and Ranexa 500 bid added.   Presents to ER today due to increased tremors. Denies any other sx. Found to be in slow VT 105-120.  ICD interrogated and VT confirmed which started yesterday am.    LVAD INTERROGATION:  HeartMate II LVAD: Flow 4.3 liters/min, speed 8800, power 5.5, PI 5.7. No PI events. Occasional low power advisories    Review of Systems: [y] = yes, [ ]  = no   General: Weight gain [ ] ; Weight loss [ ] ; Anorexia [ ] ; Fatigue [ ] ; Fever [ ] ; Chills [ ] ; Weakness [ ]   Cardiac: Chest pain/pressure [ ] ; Resting SOB [ ] ; Exertional SOB [ ] ; Orthopnea [ ] ; Pedal Edema [ ] ; Palpitations [ ] ; Syncope [ ] ; Presyncope [ ] ; Paroxysmal nocturnal dyspnea[ ]   Pulmonary: Cough [ ] ; Wheezing[ ] ; Hemoptysis[ ] ; Sputum [ ] ; Snoring [ ]   GI: Vomiting[ ] ; Dysphagia[ ] ; Melena[ ] ; Hematochezia [ ] ; Heartburn[ ] ; Abdominal pain [ ] ; Constipation [ ] ; Diarrhea [ ] ; BRBPR [ ]   GU: Hematuria[ ] ; Dysuria [ ] ; Nocturia[ ]   Vascular: Pain in legs with walking [ ] ; Pain in feet with lying flat [ ] ; Non-healing sores [ ] ; Stroke [ ] ; TIA [  ]; Slurred speech [ ] ;  Neuro: Headaches[ ] ; Vertigo[ ] ; Seizures[ ] ; Paresthesias[ ] ;Blurred vision [ ] ; Diplopia [ ] ; Vision changes [ ]   Ortho/Skin: Arthritis Cove.Etienne ]; Joint pain Cove.Etienne ]; Muscle pain [ ] ; Joint swelling [ ] ; Back Pain [ ] ; Rash [ ]   Psych: Depression[ ] ; Anxiety[ ]   Heme: Bleeding problems [ ] ; Clotting disorders [ ] ; Anemia [ ]   Endocrine: Diabetes [ ] ; Thyroid dysfunction[ ]   Home Medications Prior to Admission medications   Medication Sig Start Date End Date Taking? Authorizing Provider  acetaminophen (TYLENOL) 500 MG tablet Take 1,000 mg by mouth every 8 (eight) hours as needed for moderate pain.    Yes Historical Provider, MD  amLODipine (NORVASC) 2.5 MG tablet Take 1 tablet (2.5 mg total) by mouth daily. 08/25/14  Yes Dayna N Dunn, PA-C  carvedilol (COREG) 3.125 MG tablet Take 3.125 mg by mouth 2 (two) times daily with a meal.   Yes Historical Provider, MD  ferrous sulfate 325 (65 FE) MG tablet Take 325 mg by mouth 2 (two) times daily with a meal.    Yes Historical Provider, MD  furosemide (LASIX) 40 MG tablet Take 40 mg by mouth 2 (two) times daily.   Yes Historical Provider,  MD  HUMULIN N KWIKPEN 100 UNIT/ML Kiwkpen Inject 5 Units into the skin daily before breakfast. 05/22/14  Yes Historical Provider, MD  Multiple Vitamin (MULTIVITAMIN) capsule Take 1 capsule by mouth daily.   Yes Historical Provider, MD  potassium chloride 20 MEQ TBCR Take 1 tablet ( ) by mouth daily. 08/25/14  Yes Dayna N Dunn, PA-C  ranolazine (RANEXA) 500 MG 12 hr tablet Take 1 tablet (500 mg total) by mouth 2 (two) times daily. 09/13/14  Yes Dolores Patty, MD  tamsulosin (FLOMAX) 0.4 MG CAPS capsule Take 0.4 mg by mouth daily after supper.    Yes Historical Provider, MD  warfarin (COUMADIN) 5 MG tablet Take 2.5-5 mg by mouth See admin instructions. W-TH-F take 1 tablet SAT-SUN take 1/2 tablet   Yes Historical Provider, MD  amiodarone (PACERONE) 200 MG tablet Take 200 mg bid 09/16/14   Lyndal Pulley,  MD  warfarin (COUMADIN) 2 MG tablet Take 1 tablet ( ) by mouth daily or as directed by Coumadin clinic. Patient not taking: Reported on 09/16/2014 08/25/14   Laurann Montana, PA-C    Past Medical History: Past Medical History  Diagnosis Date  . Heart attack 04/1980  . History of quadruple bypass 07/1999    Quad Bypass, mitral valve repair  . Presence of combination internal cardiac defibrillator (ICD) and pacemaker 07/1999    Guidant #103003  . S/P ablation of atrial fibrillation 05/2002  . History of cardioversion 12/2003  . Blood infection 05/2007  . Anemia 06/2007  . Chronic systolic CHF (congestive heart failure)     a. s/p HeartMate II LVAD 6.5 years at [facility] in [place].  . LVAD (left ventricular assist device) present   . CAD (coronary artery disease)     a. s/p Quad bypass 07/1999.  . S/P ICD (internal cardiac defibrillator) procedure     a. s/p [employer] ICD 07/1999.  . Diabetes mellitus   . CKD (chronic kidney disease), stage IV   . PAF (paroxysmal atrial fibrillation)     a. Recurrence 08/2013 with VT/co-existing atrial fib s/p DCCV 08/24/14.  . Ventricular tachycardia     a. Noted on interrogation 08/2013 with co-existing AF.    Past Surgical History: Past Surgical History  Procedure Laterality Date  . Colonoscopy  05/29/2010  . Dobutamine stress echo  08/23/2012  . Hernia mesh removal  06/20/13  . Cardioversion N/A 08/24/2014    Procedure: CARDIOVERSION;  Surgeon: Laurey Morale, MD;  Location: [facility] ENDOSCOPY;  Service: Cardiovascular;  Laterality: N/A;    Family History: Family History  Problem Relation Age of Onset  . Heart disease Sister   . Diabetes Brother   . Heart disease Brother   . Heart disease Brother     Social History: History   Social History  . Marital Status: Married    Spouse Name: N/A  . Number of Children: N/A  . Years of Education: N/A   Social History Main Topics  . Smoking status: Never Smoker   . Smokeless tobacco:  Not on file  . Alcohol Use: No  . Drug Use: No  . Sexual Activity: Not on file   Other Topics Concern  . None   Social History Narrative    Allergies:  No Known Allergies  Objective:    Vital Signs:   Temp:  [98 F (36.7 C)] 98 F (36.7 C) (08/07 0926) Pulse Rate:  [110] 110 (08/07 0926) Resp:  [18] 18 (08/07 0926) BP: (96)/(75) 96/75 mmHg (08/07 0926)  SpO2:  [94 %] 94 % (08/07 0926) Weight:  [90.266 kg (199 lb)] 90.266 kg (199 lb) (08/07 0926)   Filed Weights   09/16/14 0926  Weight: 90.266 kg (199 lb)    Mean arterial Pressure 80s  Physical Exam: General: Well appearing. No resp difficulty HEENT: normal Neck: supple. JVP 8 cm. Carotids 2+ bilat; no bruits. No lymphadenopathy or thryomegaly appreciated. Cor: Mechanical heart sounds with LVAD hum present. Lungs: clear Abdomen: soft, nontender, nondistended. No hepatosplenomegaly. No bruits or masses. Good bowel sounds. Driveline: C/D/I; securement device intact and driveline incorporated Extremities: no cyanosis, clubbing, rash, edema Neuro: alert & orientedx3, cranial nerves grossly intact. moves all 4 extremities w/o difficulty. Affect pleasant   Telemetry: WCT 115  Labs: Basic Metabolic Panel:  Recent Labs Lab 09/13/14 1000 09/16/14 0923  NA 138 140  K 3.8 4.0  CL 104 103  CO2 24 28  GLUCOSE 153* 201*  BUN 31* 33*  CREATININE 1.87* 2.02*  CALCIUM 8.7* 8.8*    Liver Function Tests: No results for input(s): AST, ALT, ALKPHOS, BILITOT, PROT, ALBUMIN in the last 168 hours. No results for input(s): LIPASE, AMYLASE in the last 168 hours. No results for input(s): AMMONIA in the last 168 hours.  CBC:  Recent Labs Lab 09/13/14 1000 09/16/14 0923  WBC 7.3 6.0  NEUTROABS  --  4.7  HGB 14.8 14.1  HCT 45.8 44.0  MCV 92.2 91.1  PLT 130* 128*    Cardiac Enzymes: No results for input(s): CKTOTAL, CKMB, CKMBINDEX, TROPONINI in the last 168 hours.  BNP: BNP (last 3 results) No results for  input(s): BNP in the last 8760 hours.  ProBNP (last 3 results) No results for input(s): PROBNP in the last 8760 hours.   CBG: No results for input(s): GLUCAP in the last 168 hours.  Coagulation Studies:  Recent Labs  09/16/14 0923  LABPROT 22.8*  INR 2.03*    Other results: EKG: Wide complex tachycardia 120  Imaging:  No results found.      Assessment:   1. Recurrent VT 2. 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 Chronic systolic HF s/p HM-II LVAD 4. CKD, stage III  Plan/Discussion:     Tremors are related to recent increase in amiodarone from 200 bid to 400 bid. Will decrease back to 200 bid. Unfortunately he is back in VT this am despite recent DC-CV several weeks ago and overdrive pacing on Thursday. Fortunately rate is slower and he is asx. With the help of the [facility] rep we were able to easily pace him out of VT today back into NSR.   I discussed the case with Dr. Graciela Husbands and we have added a ATP zone only starting at HR of 105. (VT zone starts at 165 bpm). Will continue amio at decreased dose of 200 bid as well as Ranexa to potentiate amio effect.   VAD parameters stable except for low flow advisories when he disconnects one of his batteries late in the afternoon to check his device.   INR good 2.0  I reviewed the LVAD parameters from today, and compared the results to the patient's prior recorded data.  No programming changes were made.  The LVAD is functioning within specified parameters.  The patient performs LVAD self-test daily.  LVAD interrogation was negative for any significant power changes, alarms or PI events/speed drops.  LVAD equipment check completed and is in good working order.  Back-up equipment present.   LVAD education done on emergency procedures and precautions and reviewed  exit site care.  Length of Stay:   Arvilla Meres MD 09/16/2014, 4:30 PM  VAD Team Pager [phone] (7am - 7am) +++VAD ISSUES ONLY+++ Advanced Heart Failure Team Pager  [phone] (M-F; 7a - 4p)  Please contact [facility] for night-coverage after hours (4p -7a ) and weekends on [email or web address] for all non- LVAD Issues

## 2014-09-16 NOTE — ED Notes (Signed)
Pt changed his medications on Friday, (amiodarone, dosage was increased, and Ranexa was added to his list of medications.) He has been weak and shaking since the medication change.  Pt came to the ED for further eval.

## 2014-09-16 NOTE — ED Provider Notes (Signed)
CSN: [id number]     Arrival date & time 09/16/14  0905 History   First MD Initiated Contact with Patient 09/16/14 [phone]     Chief Complaint  Patient presents with  . Weakness  . Shaking     (Consider location/radiation/quality/duration/timing/severity/associated sxs/prior Treatment) Patient is a 74 y.o. male presenting with neurologic complaint. The history is provided by the patient.  Neurologic Problem This is a new problem. The current episode started 2 days ago. The problem occurs constantly. The problem has been gradually worsening. Pertinent negatives include no chest pain and no shortness of breath. Nothing aggravates the symptoms. Nothing relieves the symptoms. He has tried nothing for the symptoms. The treatment provided no relief.    Past Medical History  Diagnosis Date  . Heart attack 04/1980  . History of quadruple bypass 07/1999    Quad Bypass, mitral valve repair  . Presence of combination internal cardiac defibrillator (ICD) and pacemaker 07/1999    Guidant #103003  . S/P ablation of atrial fibrillation 05/2002  . History of cardioversion 12/2003  . Blood infection 05/2007  . Anemia 06/2007  . Chronic systolic CHF (congestive heart failure)     a. 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  . LVAD (left ventricular assist device) present   . CAD (coronary artery disease)     a. s/p Quad bypass 07/1999.  . S/P ICD (internal cardiac defibrillator) procedure     a. s/p [employer] ICD 07/1999.  . Diabetes mellitus   . CKD (chronic kidney disease), stage IV   . PAF (paroxysmal atrial fibrillation)     a. Recurrence 08/2013 with VT/co-existing atrial fib s/p DCCV 08/24/14.  . Ventricular tachycardia     a. Noted on interrogation 08/2013 with co-existing AF.   Past Surgical History  Procedure Laterality Date  . Colonoscopy  05/29/2010  . Dobutamine stress echo  08/23/2012  . Hernia mesh removal  06/20/13  . Cardioversion N/A 08/24/2014   Procedure: CARDIOVERSION;  Surgeon: Laurey Morale, MD;  Location: [facility] ENDOSCOPY;  Service: Cardiovascular;  Laterality: N/A;   Family History  Problem Relation Age of Onset  . Heart disease Sister   . Diabetes Brother   . Heart disease Brother   . Heart disease Brother    History  Substance Use Topics  . Smoking status: Never Smoker   . Smokeless tobacco: Not on file  . Alcohol Use: No    Review of Systems  Respiratory: Negative for shortness of breath.   Cardiovascular: Negative for chest pain.  All other systems reviewed and are negative.     Allergies  Review of patient's allergies indicates no known allergies.  Home Medications   Prior to Admission medications   Medication Sig Start Date End Date Taking? Authorizing Provider  acetaminophen (TYLENOL) 500 MG tablet Take 1,000 mg by mouth every 8 (eight) hours as needed for moderate pain.    Yes Historical Provider, MD  amLODipine (NORVASC) 2.5 MG tablet Take 1 tablet (2.5 mg total) by mouth daily. 08/25/14  Yes Dayna N Dunn, PA-C  carvedilol (COREG) 3.125 MG tablet Take 3.125 mg by mouth 2 (two) times daily with a meal.   Yes Historical Provider, MD  ferrous sulfate 325 (65 FE) MG tablet Take 325 mg by mouth 2 (two) times daily with a meal.    Yes Historical Provider, MD  furosemide (LASIX) 40 MG tablet Take 40 mg by mouth 2 (two) times daily.   Yes Historical Provider,  MD  HUMULIN N KWIKPEN 100 UNIT/ML Kiwkpen Inject 5 Units into the skin daily before breakfast. 05/22/14  Yes Historical Provider, MD  Multiple Vitamin (MULTIVITAMIN) capsule Take 1 capsule by mouth daily.   Yes Historical Provider, MD  potassium chloride 20 MEQ TBCR Take 1 tablet ( ) by mouth daily. 08/25/14  Yes Dayna N Dunn, PA-C  ranolazine (RANEXA) 500 MG 12 hr tablet Take 1 tablet (500 mg total) by mouth 2 (two) times daily. 09/13/14  Yes Dolores Patty, MD  tamsulosin (FLOMAX) 0.4 MG CAPS capsule Take 0.4 mg by mouth daily after supper.    Yes  Historical Provider, MD  warfarin (COUMADIN) 5 MG tablet Take 2.5-5 mg by mouth See admin instructions. W-TH-F take 1 tablet SAT-SUN take 1/2 tablet   Yes Historical Provider, MD  amiodarone (PACERONE) 200 MG tablet Take 200 mg bid 09/16/14   Lyndal Pulley, MD  warfarin (COUMADIN) 2 MG tablet Take 1 tablet (2mg ) by mouth daily or as directed by Coumadin clinic. Patient not taking: Reported on 09/16/2014 08/25/14   Dayna N Dunn, PA-C   BP 96/75 mmHg  Pulse 110  Temp(Src) 98 F (36.7 C) (Oral)  Resp 18  Ht 5\' 6"  (1.676 m)  Wt 199 lb (90.266 kg)  BMI 32.13 kg/m2  SpO2 94% Physical Exam  Constitutional: He is oriented to person, place, and time. He appears well-developed and well-nourished. No distress.  HENT:  Head: Normocephalic and atraumatic.  Eyes: Conjunctivae are normal.  Neck: Neck supple. No tracheal deviation present.  Cardiovascular:  Mechanical sounds over precordium  Pulmonary/Chest: Effort normal and breath sounds normal. No respiratory distress. He has no wheezes.  Abdominal: Soft. He exhibits no distension. There is no tenderness.  Neurological: He is alert and oriented to person, place, and time. He displays tremor (fine motor with movement).  Skin: Skin is warm and dry.  Psychiatric: He has a normal mood and affect.    ED Course  Procedures (including critical care time) Labs Review Labs Reviewed  BASIC METABOLIC PANEL - Abnormal; Notable for the following:    Glucose, Bld 201 (*)    BUN 33 (*)    Creatinine, Ser 2.02 (*)    Calcium 8.8 (*)    GFR calc non Af Amer 31 (*)    GFR calc Af Amer 35 (*)    All other components within normal limits  CBC WITH DIFFERENTIAL/PLATELET - Abnormal; Notable for the following:    RDW 17.4 (*)    Platelets 128 (*)    Neutrophils Relative % 78 (*)    Lymphocytes Relative 10 (*)    Lymphs Abs 0.6 (*)    All other components within normal limits  PROTIME-INR - Abnormal; Notable for the following:    Prothrombin Time 22.8 (*)     INR 2.03 (*)    All other components within normal limits    Imaging Review No results found.   EKG Interpretation   Date/Time:  Sunday September 16 2014 13:44:33 EDT Ventricular Rate:  69 PR Interval:  124 QRS Duration: 172 QT Interval:  542 QTC Calculation: 581 R Axis:   -98 Text Interpretation:  overdrive pacing VT  RESOLVED SINCE PREVIOUS Since  last tracing rate slower Confirmed by Shonte Soderlund MD, Reuel Boom ([id number]) on 09/16/2014  2:12:40 PM      MDM   Final diagnoses:  V-tach  LVAD (left ventricular assist device) present  Adverse effect of amiodarone, initial encounter   74 yo M s/p LVAD placement presents  with tremor worsening since making medication changes in recent clinic visit. No current cardiac complaints. EKG on arrival is c/w VT persistent from clinic at slower rate since starting higher level of amio. Consulted HF team who will interrogate device, settings changed and Pt observed in ED, successfully overdrive paced to lower rate no longer in VT. Cardiologist recommended lowering amio to 200 BID given new symptoms of tremor and cleared Pt for d/c. Plan to follow up with primary team as needed and return precautions discussed for worsening or new concerning symptoms.     Lyndal Pulley, MD 09/16/14 [phone]

## 2015-02-17 ENCOUNTER — Other Ambulatory Visit: Payer: Self-pay | Admitting: Internal Medicine

## 2015-05-25 ENCOUNTER — Emergency Department (HOSPITAL_COMMUNITY): Payer: Medicare Other

## 2015-05-25 ENCOUNTER — Encounter (HOSPITAL_COMMUNITY): Payer: Self-pay | Admitting: Emergency Medicine

## 2015-05-25 ENCOUNTER — Emergency Department (HOSPITAL_COMMUNITY)
Admission: EM | Admit: 2015-05-25 | Discharge: 2015-05-25 | Disposition: A | Discharge status: Home / Self Care | Payer: Medicare Other | Attending: Emergency Medicine | Admitting: Emergency Medicine

## 2015-05-25 DIAGNOSIS — Z7901 Long term (current) use of anticoagulants: Secondary | ICD-10-CM | POA: Diagnosis not present

## 2015-05-25 DIAGNOSIS — Y92480 Sidewalk as the place of occurrence of the external cause: Secondary | ICD-10-CM | POA: Diagnosis not present

## 2015-05-25 DIAGNOSIS — Z8619 Personal history of other infectious and parasitic diseases: Secondary | ICD-10-CM | POA: Insufficient documentation

## 2015-05-25 DIAGNOSIS — I480 Paroxysmal atrial fibrillation: Secondary | ICD-10-CM | POA: Insufficient documentation

## 2015-05-25 DIAGNOSIS — Z95811 Presence of heart assist device: Secondary | ICD-10-CM | POA: Insufficient documentation

## 2015-05-25 DIAGNOSIS — I5022 Chronic systolic (congestive) heart failure: Secondary | ICD-10-CM | POA: Diagnosis not present

## 2015-05-25 DIAGNOSIS — I2510 Atherosclerotic heart disease of native coronary artery without angina pectoris: Secondary | ICD-10-CM | POA: Insufficient documentation

## 2015-05-25 DIAGNOSIS — Z794 Long term (current) use of insulin: Secondary | ICD-10-CM | POA: Insufficient documentation

## 2015-05-25 DIAGNOSIS — Z79899 Other long term (current) drug therapy: Secondary | ICD-10-CM | POA: Diagnosis not present

## 2015-05-25 DIAGNOSIS — N184 Chronic kidney disease, stage 4 (severe): Secondary | ICD-10-CM | POA: Diagnosis not present

## 2015-05-25 DIAGNOSIS — E119 Type 2 diabetes mellitus without complications: Secondary | ICD-10-CM | POA: Insufficient documentation

## 2015-05-25 DIAGNOSIS — I252 Old myocardial infarction: Secondary | ICD-10-CM | POA: Insufficient documentation

## 2015-05-25 DIAGNOSIS — D649 Anemia, unspecified: Secondary | ICD-10-CM | POA: Diagnosis not present

## 2015-05-25 DIAGNOSIS — S80211A Abrasion, right knee, initial encounter: Secondary | ICD-10-CM | POA: Diagnosis not present

## 2015-05-25 DIAGNOSIS — W1839XA Other fall on same level, initial encounter: Secondary | ICD-10-CM | POA: Insufficient documentation

## 2015-05-25 DIAGNOSIS — Y998 Other external cause status: Secondary | ICD-10-CM | POA: Diagnosis not present

## 2015-05-25 DIAGNOSIS — Z951 Presence of aortocoronary bypass graft: Secondary | ICD-10-CM | POA: Diagnosis not present

## 2015-05-25 DIAGNOSIS — R531 Weakness: Secondary | ICD-10-CM | POA: Diagnosis not present

## 2015-05-25 DIAGNOSIS — S80212A Abrasion, left knee, initial encounter: Secondary | ICD-10-CM | POA: Diagnosis not present

## 2015-05-25 DIAGNOSIS — Y9389 Activity, other specified: Secondary | ICD-10-CM | POA: Insufficient documentation

## 2015-05-25 LAB — COMPREHENSIVE METABOLIC PANEL
ALK PHOS: 80 U/L (ref 38–126)
ALT: 56 U/L (ref 17–63)
ANION GAP: 10 (ref 5–15)
AST: 64 U/L — AB (ref 15–41)
Albumin: 3.3 g/dL — ABNORMAL LOW (ref 3.5–5.0)
BILIRUBIN TOTAL: 1.3 mg/dL — AB (ref 0.3–1.2)
BUN: 27 mg/dL — ABNORMAL HIGH (ref 6–20)
CO2: 24 mmol/L (ref 22–32)
Calcium: 8.5 mg/dL — ABNORMAL LOW (ref 8.9–10.3)
Chloride: 107 mmol/L (ref 101–111)
Creatinine, Ser: 1.91 mg/dL — ABNORMAL HIGH (ref 0.61–1.24)
GFR calc Af Amer: 38 mL/min — ABNORMAL LOW (ref >60)
GFR calc non Af Amer: 32 mL/min — ABNORMAL LOW (ref >60)
GLUCOSE: 113 mg/dL — AB (ref 65–99)
Potassium: 3.8 mmol/L (ref 3.5–5.1)
SODIUM: 141 mmol/L (ref 135–145)
Total Protein: 6 g/dL — ABNORMAL LOW (ref 6.5–8.1)

## 2015-05-25 LAB — CBC WITH DIFFERENTIAL/PLATELET
BASOS ABS: 0 10*3/uL (ref 0.0–0.1)
BASOS PCT: 1 %
EOS ABS: 0.2 10*3/uL (ref 0.0–0.7)
Eosinophils Relative: 2 %
HEMATOCRIT: 39.5 % (ref 39.0–52.0)
HEMOGLOBIN: 12.4 g/dL — AB (ref 13.0–17.0)
Lymphocytes Relative: 10 %
Lymphs Abs: 0.7 10*3/uL (ref 0.7–4.0)
MCH: 28.1 pg (ref 26.0–34.0)
MCHC: 31.4 g/dL (ref 30.0–36.0)
MCV: 89.6 fL (ref 78.0–100.0)
Monocytes Absolute: 0.6 10*3/uL (ref 0.1–1.0)
Monocytes Relative: 8 %
NEUTROS ABS: 5.9 10*3/uL (ref 1.7–7.7)
NEUTROS PCT: 79 %
Platelets: 127 10*3/uL — ABNORMAL LOW (ref 150–400)
RBC: 4.41 MIL/uL (ref 4.22–5.81)
RDW: 16.5 % — ABNORMAL HIGH (ref 11.5–15.5)
WBC: 7.4 10*3/uL (ref 4.0–10.5)

## 2015-05-25 LAB — I-STAT TROPONIN, ED: TROPONIN I, POC: 0.02 ng/mL (ref 0.00–0.08)

## 2015-05-25 LAB — PROTIME-INR
INR: 1.93 — ABNORMAL HIGH (ref 0.00–1.49)
PROTHROMBIN TIME: 21.9 s — AB (ref 11.6–15.2)

## 2015-05-25 LAB — LACTATE DEHYDROGENASE: LDH: 382 U/L — ABNORMAL HIGH (ref 98–192)

## 2015-05-25 NOTE — Discharge Instructions (Signed)

## 2015-05-25 NOTE — Significant Event (Signed)
Rapid Response Event Note  Overview:  Called to see patient when admitted to ED because of LVAD in place.    Initial Focused Assessment: Patient is alert and oriented. Complaining that his 'legs just gave way.' Has been on battery for approx 8 hours; he usually gets 14 hours from batteries, so 6 hours left. Wife has gone home to get replacement batteries; she arrives before the interview is over.  Interventions: DIscussed protocol for obtaining MAP pressure using a doppler with RN. Unable to document pump information as the patient has an older model without this information available except by interrogation. Paged LVAD coordinator; relayed instructions to RN and MD for request to add INR and LDH to labs, and page Dr. Gala RomneyBensimhon when assesesment is complete.   Event Summary:   Call ended at  1610. Patient stable.     at          Kristine LineaLackey, Edwena Mayorga Ann

## 2015-05-25 NOTE — ED Notes (Signed)
RR at bedside assisting with contacting LVAD team

## 2015-05-25 NOTE — ED Notes (Signed)
MD at bedside. 

## 2015-05-25 NOTE — ED Notes (Signed)
Patient able to ambulate independently  

## 2015-05-25 NOTE — ED Notes (Signed)
RR made aware of patient's presence and condition

## 2015-05-25 NOTE — ED Notes (Signed)
Per EMS:  Pt is an LVAD patient.  RR notified.  Pt presents to ED after he had an episode of bilateral leg weakness walking in to his house from the car this afternoon.  Pt knelt down and then sat down on his sidewalk, and family was unable to help him back to standing.  Pulse 70, SpO2 95%.  Denies fall, LOC, dizziness, n/v, CP or any alarms from his LVAD device.

## 2015-05-25 NOTE — ED Notes (Signed)
X-ray at bedside

## 2015-05-25 NOTE — ED Provider Notes (Addendum)
CSN: [id number]     Arrival date & time 05/25/15  1612 History   First MD Initiated Contact with Patient 05/25/15 1617     Chief Complaint  Patient presents with  . Weakness     (Consider location/radiation/quality/duration/timing/severity/associated sxs/prior Treatment) HPI Comments: Hx of LVAD and last time he had a similar attack he was in a.fib.  Patient is a 75 y.o. male presenting with weakness. The history is provided by the patient.  Weakness This is a new problem. The current episode started less than 1 hour ago. The problem occurs constantly. The problem has been resolved. Pertinent negatives include no chest pain, no abdominal pain, no headaches and no shortness of breath. Associated symptoms comments: Patient states he was at his brother's house and he started to walk out to the car when he started to feel generally weak. He states his legs just could not hold him up anymore and he fell down to his knees. He denies any other symptoms of chest pain, shortness of breath, palpitations, dizziness, near syncope, nausea, vomiting, sweating. He states symptoms resolved within about 45 minutes without intervention.  . The symptoms are aggravated by standing. The symptoms are relieved by rest. He has tried nothing for the symptoms. The treatment provided significant relief.    Past Medical History  Diagnosis Date  . Heart attack (HCC) 04/1980  . History of quadruple bypass 07/1999    Quad Bypass, mitral valve repair  . Presence of combination internal cardiac defibrillator (ICD) and pacemaker 07/1999    Guidant #103003  . S/P ablation of atrial fibrillation 05/2002  . History of cardioversion 12/2003  . Blood infection (HCC) 05/2007  . Anemia 06/2007  . LVAD (left ventricular assist device) present (HCC)   . CAD (coronary artery disease)     a. s/p Quad bypass 07/1999.  . S/P ICD (internal cardiac defibrillator) procedure     a. s/p [employer] ICD 07/1999.  . Diabetes mellitus  (HCC)   . CKD (chronic kidney disease), stage IV (HCC)   . PAF (paroxysmal atrial fibrillation) (HCC)     a. Recurrence 08/2013 with VT/co-existing atrial fib s/p DCCV 08/24/14.  . Ventricular tachycardia (HCC)     a. Noted on interrogation 08/2013 with co-existing AF.  Marland Kitchen. Chronic systolic CHF (congestive heart failure) (HCC)     a. 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   Past Surgical History  Procedure Laterality Date  . Colonoscopy  05/29/2010  . Dobutamine stress echo  08/23/2012  . Hernia mesh removal  06/20/13  . Cardioversion N/A 08/24/2014    Procedure: CARDIOVERSION;  Surgeon: Laurey Moralealton S McLean, MD;  [facility];  Service: Cardiovascular;  Laterality: N/A;   Family History  Problem Relation Age of Onset  . Heart disease Sister   . Diabetes Brother   . Heart disease Brother   . Heart disease Brother    Social History  Substance Use Topics  . Smoking status: Never Smoker   . Smokeless tobacco: None  . Alcohol Use: No    Review of Systems  Respiratory: Negative for shortness of breath.   Cardiovascular: Negative for chest pain.  Gastrointestinal: Negative for abdominal pain.  Neurological: Positive for weakness. Negative for headaches.  All other systems reviewed and are negative.     Allergies  Review of patient's allergies indicates no known allergies.  Home Medications   Prior to Admission medications   Medication Sig Start Date End Date Taking? Authorizing  Provider  acetaminophen (TYLENOL) 500 MG tablet Take 1,000 mg by mouth every 8 (eight) hours as needed for moderate pain.     Historical Provider, MD  amiodarone (PACERONE) 200 MG tablet TAKE 1 TABLET BY MOUTH TWICE A DAY 02/18/15   Dolores Patty, MD  amLODipine (NORVASC) 2.5 MG tablet Take 1 tablet (2.5 mg total) by mouth daily. 08/25/14   Dayna N Dunn, PA-C  carvedilol (COREG) 3.125 MG tablet Take 3.125 mg by mouth 2 (two) times daily with a meal.    Historical Provider,  MD  ferrous sulfate 325 (65 FE) MG tablet Take 325 mg by mouth 2 (two) times daily with a meal.     Historical Provider, MD  furosemide (LASIX) 40 MG tablet Take 40 mg by mouth 2 (two) times daily.    Historical Provider, MD  Bing Ree KWIKPEN 100 UNIT/ML Kiwkpen Inject 5 Units into the skin daily before breakfast. 05/22/14   Historical Provider, MD  Multiple Vitamin (MULTIVITAMIN) capsule Take 1 capsule by mouth daily.    Historical Provider, MD  potassium chloride 20 MEQ TBCR Take 1 tablet ( ) by mouth daily. 08/25/14   Dayna N Dunn, PA-C  ranolazine (RANEXA) 500 MG 12 hr tablet Take 1 tablet (500 mg total) by mouth 2 (two) times daily. 09/13/14   Dolores Patty, MD  tamsulosin (FLOMAX) 0.4 MG CAPS capsule Take 0.4 mg by mouth daily after supper.     Historical Provider, MD  warfarin (COUMADIN) 2 MG tablet Take 1 tablet ( ) by mouth daily or as directed by Coumadin clinic. Patient not taking: Reported on 09/16/2014 08/25/14   Dayna N Dunn, PA-C  warfarin (COUMADIN) 5 MG tablet Take 2.5-5 mg by mouth See admin instructions. W-TH-F take 1 tablet SAT-SUN take 1/2 tablet    Historical Provider, MD   BP 149/136 mmHg  Pulse 87  Resp 14  SpO2 100% Physical Exam  Constitutional: He is oriented to person, place, and time. He appears well-developed and well-nourished. No distress.  HENT:  Head: Normocephalic and atraumatic.  Mouth/Throat: Oropharynx is clear and moist.  Eyes: Conjunctivae and EOM are normal. Pupils are equal, round, and reactive to light.  Neck: Normal range of motion. Neck supple.  Cardiovascular:  No distal pulses.  Mechanical hum when listening to heart  Pulmonary/Chest: Effort normal and breath sounds normal. No respiratory distress. He has no wheezes. He has no rales.  Abdominal: Soft. He exhibits no distension. There is no tenderness. There is no rebound and no guarding.  LVAD pump site well appearing  Musculoskeletal: Normal range of motion. He exhibits no edema or  tenderness.       Legs: Abrasions over both knees  Neurological: He is alert and oriented to person, place, and time.  Skin: Skin is warm and dry. No rash noted. No erythema.  Psychiatric: He has a normal mood and affect. His behavior is normal.  Nursing note and vitals reviewed.   ED Course  Procedures (including critical care time) Labs Review Labs Reviewed  CBC WITH DIFFERENTIAL/PLATELET - Abnormal; Notable for the following:    Hemoglobin 12.4 (*)    RDW 16.5 (*)    Platelets 127 (*)    All other components within normal limits  COMPREHENSIVE METABOLIC PANEL - Abnormal; Notable for the following:    Glucose, Bld 113 (*)    BUN 27 (*)    Creatinine, Ser 1.91 (*)    Calcium 8.5 (*)    Total Protein 6.0 (*)  Albumin 3.3 (*)    AST 64 (*)    Total Bilirubin 1.3 (*)    GFR calc non Af Amer 32 (*)    GFR calc Af Amer 38 (*)    All other components within normal limits  LACTATE DEHYDROGENASE - Abnormal; Notable for the following:    LDH 382 (*)    All other components within normal limits  PROTIME-INR - Abnormal; Notable for the following:    Prothrombin Time 21.9 (*)    INR 1.93 (*)    All other components within normal limits  Rosezena Sensor, ED    Imaging Review Dg Chest Port 1 View  05/25/2015  CLINICAL DATA:  75 year old male with weakness. Fall. History of LVAD. EXAM: PORTABLE CHEST 1 VIEW COMPARISON:  Chest x-ray a 08/23/2014. FINDINGS: Lung volumes are very low. Opacity at the left base favored to be secondary to underpenetration of the film, although some left basilar atelectasis may also be present. No definite pleural effusions. No evidence of pulmonary edema. Heart size is mildly enlarged. Upper mediastinal contours are within normal limits. Atherosclerosis in the thoracic aorta. Status post median sternotomy. Left ventricular assist device noted. Left-sided biventricular pacemaker/AICD in position with lead tips projecting over the expected location of the  right ventricular apex, right atrium, and overlying the lateral ventricle via the coronary sinus and coronary veins. IMPRESSION: 1. Low lung volumes without radiographic evidence of acute cardiopulmonary disease. 2. Mild cardiomegaly. 3. Atherosclerosis. 4. Postoperative changes and support apparatus, as above. Electronically Signed   By: Trudie Reed M.D.   On: 05/25/2015 17:17   I have personally reviewed and evaluated these images and lab results as part of my medical decision-making.   EKG Interpretation   Date/Time:  Saturday May 25 2015 16:12:32 EDT Ventricular Rate:  70 PR Interval:    QRS Duration: 153 QT Interval:  459 QTC Calculation: 495 R Axis:   -91 Text Interpretation:  Accelerated junctional rhythm Right bundle branch  block Inferior infarct, old No significant change since last tracing  Confirmed by Anitra Lauth  MD, Alphonzo Lemmings ([id number]) on 05/25/2015 4:16:06 PM      MDM   Final diagnoses:  Weakness    Patient is a 75 year old male with a history with an LVAD presenting with 45 minutes of weakness that occurred today. The weakness was generalized causing him to fall to his knees but he denies any near syncopal feelings, chest pain, palpitations or dizziness. He did not hit his head or lose consciousness. He states he is pain feeling his normal self all day long. Rapid response nurse states last time he did this he had an episode of atrial fibrillation. Patient currently is in a paced rhythm at 65. EKG is unchanged from prior. He does state that he has dark stools but that is calm and because he takes iron daily.  LVAD site looks normal without any signs of infection. Patient has no strokelike symptoms in the normal exam.  CBC, CMP, LDH, troponin, INR, chest x-ray pending  7:21 PM Labs without acute findings. Spoke with Dr. Jones Broom and he feels that the patient is safe for discharge home. Discussed with patient and his wife and they want to go home. They know to contact  their coordinator Kirt Boys if they have any issues. They have an appointment on Monday for follow-up. Patient was able to ambulate here without sensations of weakness.  Gwyneth Sprout, MD 05/25/15 Ernestina Columbia  Gwyneth Sprout, MD 05/25/15 [phone]

## 2015-06-03 ENCOUNTER — Telehealth (HOSPITAL_COMMUNITY): Payer: Self-pay | Admitting: *Deleted

## 2015-06-03 ENCOUNTER — Other Ambulatory Visit (HOSPITAL_COMMUNITY): Payer: Self-pay | Admitting: *Deleted

## 2015-06-03 DIAGNOSIS — Z7901 Long term (current) use of anticoagulants: Secondary | ICD-10-CM

## 2015-06-03 DIAGNOSIS — Z95811 Presence of heart assist device: Secondary | ICD-10-CM

## 2015-06-03 NOTE — Telephone Encounter (Signed)
Pt called to f/u recent ED visit on 05/25/15.  States he feels better overall, but still is c/o "leg weakness". Pt is using cane at home, but is concerned and would like to be seen. Appt scheduled for VAD clinic visit Thursday 06/06/15 at 10:00am. Pt verbalized understanding of same.

## 2015-06-06 ENCOUNTER — Ambulatory Visit (HOSPITAL_COMMUNITY)
Admission: RE | Admit: 2015-06-06 | Discharge: 2015-06-06 | Disposition: A | Discharge status: Home / Self Care | Payer: Medicare Other | Source: Ambulatory Visit | Attending: Cardiology | Admitting: Cardiology

## 2015-06-06 ENCOUNTER — Other Ambulatory Visit (HOSPITAL_COMMUNITY): Payer: Self-pay | Admitting: Unknown Physician Specialty

## 2015-06-06 VITALS — BP 110/0 | HR 70 | Ht 66.0 in | Wt 214.2 lb

## 2015-06-06 DIAGNOSIS — N184 Chronic kidney disease, stage 4 (severe): Secondary | ICD-10-CM | POA: Insufficient documentation

## 2015-06-06 DIAGNOSIS — I472 Ventricular tachycardia, unspecified: Secondary | ICD-10-CM

## 2015-06-06 DIAGNOSIS — Z951 Presence of aortocoronary bypass graft: Secondary | ICD-10-CM | POA: Diagnosis not present

## 2015-06-06 DIAGNOSIS — Z7901 Long term (current) use of anticoagulants: Secondary | ICD-10-CM | POA: Insufficient documentation

## 2015-06-06 DIAGNOSIS — R531 Weakness: Secondary | ICD-10-CM | POA: Diagnosis not present

## 2015-06-06 DIAGNOSIS — I2510 Atherosclerotic heart disease of native coronary artery without angina pectoris: Secondary | ICD-10-CM | POA: Diagnosis not present

## 2015-06-06 DIAGNOSIS — Z794 Long term (current) use of insulin: Secondary | ICD-10-CM | POA: Insufficient documentation

## 2015-06-06 DIAGNOSIS — R29898 Other symptoms and signs involving the musculoskeletal system: Secondary | ICD-10-CM | POA: Diagnosis not present

## 2015-06-06 DIAGNOSIS — I252 Old myocardial infarction: Secondary | ICD-10-CM | POA: Diagnosis not present

## 2015-06-06 DIAGNOSIS — I5022 Chronic systolic (congestive) heart failure: Secondary | ICD-10-CM | POA: Diagnosis present

## 2015-06-06 DIAGNOSIS — I480 Paroxysmal atrial fibrillation: Secondary | ICD-10-CM | POA: Insufficient documentation

## 2015-06-06 DIAGNOSIS — E1122 Type 2 diabetes mellitus with diabetic chronic kidney disease: Secondary | ICD-10-CM | POA: Diagnosis not present

## 2015-06-06 DIAGNOSIS — Z79899 Other long term (current) drug therapy: Secondary | ICD-10-CM | POA: Diagnosis not present

## 2015-06-06 DIAGNOSIS — Z95811 Presence of heart assist device: Secondary | ICD-10-CM | POA: Diagnosis not present

## 2015-06-06 DIAGNOSIS — I130 Hypertensive heart and chronic kidney disease with heart failure and stage 1 through stage 4 chronic kidney disease, or unspecified chronic kidney disease: Secondary | ICD-10-CM | POA: Diagnosis not present

## 2015-06-06 DIAGNOSIS — I255 Ischemic cardiomyopathy: Secondary | ICD-10-CM | POA: Insufficient documentation

## 2015-06-06 LAB — CBC
HCT: 42.9 % (ref 39.0–52.0)
Hemoglobin: 13.6 g/dL (ref 13.0–17.0)
MCH: 28.9 pg (ref 26.0–34.0)
MCHC: 31.7 g/dL (ref 30.0–36.0)
MCV: 91.1 fL (ref 78.0–100.0)
PLATELETS: 157 10*3/uL (ref 150–400)
RBC: 4.71 MIL/uL (ref 4.22–5.81)
RDW: 16.7 % — ABNORMAL HIGH (ref 11.5–15.5)
WBC: 7 10*3/uL (ref 4.0–10.5)

## 2015-06-06 LAB — PROTIME-INR
INR: 2.05 — ABNORMAL HIGH (ref 0.00–1.49)
Prothrombin Time: 23 seconds — ABNORMAL HIGH (ref 11.6–15.2)

## 2015-06-06 LAB — BASIC METABOLIC PANEL
Anion gap: 10 (ref 5–15)
BUN: 26 mg/dL — ABNORMAL HIGH (ref 6–20)
CHLORIDE: 103 mmol/L (ref 101–111)
CO2: 28 mmol/L (ref 22–32)
CREATININE: 1.85 mg/dL — AB (ref 0.61–1.24)
Calcium: 9.2 mg/dL (ref 8.9–10.3)
GFR calc Af Amer: 39 mL/min — ABNORMAL LOW (ref >60)
GFR calc non Af Amer: 34 mL/min — ABNORMAL LOW (ref >60)
Glucose, Bld: 124 mg/dL — ABNORMAL HIGH (ref 65–99)
Potassium: 3.9 mmol/L (ref 3.5–5.1)
SODIUM: 141 mmol/L (ref 135–145)

## 2015-06-06 LAB — LACTATE DEHYDROGENASE: LDH: 414 U/L — ABNORMAL HIGH (ref 98–192)

## 2015-06-06 NOTE — Progress Notes (Signed)
Pt presents for f/u from recent ED visit on 05/25/15 Pt asked that we include Shanna CiscoSheri Lee, NP at his [facility] center at [facility]. Her contact info:  [phone]  Vital signs: HR:  103 MAP BP:  96 O2 Sat:  92; 93 Wt:  216.4 lbs  LVAD interrogation reveals:  Speed:  8800 Flow:  4.7 Power:  6.0 PI:  5.0 Alarms:  Few low voltage advisories Events:  2 - 5 PI events Fixed speed:  Low speed limit:   Pt/caregiver deny any alarms or VAD equipment issues. VAD coordinator reviewed daily log from home for daily temperature, weight, and VAD parameters.  LVAD equipment check completed and is in good working order. Back-up equipment present. LVAD education done on emergency procedures and precautions and reviewed exit site care.   Pt states he feels better overall; no further cough; denies fevers or chills. Will send lab results to his VAD center and [facility] center.   Emergency contact info as well as clinic contact information given to pt/wife. Instructed them to call if any issues or concerns. Both agreed to same.  Paperwork faxed to [facility] for consult due to weakness in his legs.

## 2015-06-06 NOTE — Patient Instructions (Signed)
1. Will [facility] blood work to [facility]. 2. Someone will call you from [facility] for an Out pt evaluation. 3. We will see you as needed in VAD clinic.

## 2015-06-09 DIAGNOSIS — R29898 Other symptoms and signs involving the musculoskeletal system: Secondary | ICD-10-CM | POA: Insufficient documentation

## 2015-06-09 NOTE — Progress Notes (Signed)
Patient ID: Juliette AlcideRobert Knodel, male   DOB: [birth year], 75 y.o.   MRN: [id number]  VAD CLINIC OUTPATIENT NOTE  [facility]P: [facility]   HPI:  Juliette AlcideRobert Nop is a 75 y.o. 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   [facility] with dizziness and weakness. HR noted to be in the 140s with wide complex tachycardia. MAP preserved 80s-90s. Interrogation of his [employer] device has shown ventricular tachycardia with co-existing atrial fibrillation. EP saw patient, tried to pace him out of VT. Unable to terminate VT but HR decreased to 120s (slower VT). Then underwent DC-CV on 08/24/14. Amio increased from 100 bid to 200 bid.   Follow up for Heart Failure/LVAD: Presents to clinic today for post-ED f/u. Says he is doing ok overall but struggling with weakness in his legs. Was seen in ED for legs giving out. Denies frank syncope. Work-up in ER with normal labs. No events on ICD. He hss had work up [place] for leg weakness. Was told he had neuropathy but they told him they didn't want him to have nerve conduction studies due to LVAD. Says legs get weak and he just can't stand. No focal neuro deficits. No orthopnea or PND. Occasional edema. No bleeding or dark urine.    Denies LVAD alarms.  Denies driveline trauma, erythema or drainage.  Denies ICD shocks.   Reports taking Coumadin as prescribed and adherence to anticoagulation based dietary restrictions.  Denies bright red blood per rectum or melena, no dark urine or hematuria.     Past Medical History  Diagnosis Date  . Heart attack (HCC) 04/1980  . History of quadruple bypass 07/1999    Quad Bypass, mitral valve repair  . Presence of combination internal cardiac defibrillator (ICD) and pacemaker 07/1999    Guidant #103003  . S/P ablation of atrial fibrillation 05/2002  . History of  cardioversion 12/2003  . Blood infection (HCC) 05/2007  . Anemia 06/2007  . LVAD (left ventricular assist device) present (HCC)   . CAD (coronary artery disease)     a. s/p Quad bypass 07/1999.  . S/P ICD (internal cardiac defibrillator) procedure     a. s/p [employer] ICD 07/1999.  . Diabetes mellitus (HCC)   . CKD (chronic kidney disease), stage IV (HCC)   . PAF (paroxysmal atrial fibrillation) (HCC)     a. Recurrence 08/2013 with VT/co-existing atrial fib s/p DCCV 08/24/14.  . Ventricular tachycardia (HCC)     a. Noted on interrogation 08/2013 with co-existing AF.  Marland Kitchen. Chronic systolic CHF (congestive heart failure) (HCC)     a. s/p HeartMate II LVAD 6.5 years at [facility] in [place].    Current Outpatient Prescriptions  Medication Sig Dispense Refill  . acetaminophen (TYLENOL) 500 MG tablet Take 1,000 mg by mouth every 8 (eight) hours as needed for moderate pain.     Marland Kitchen. amiodarone (PACERONE) 200 MG tablet TAKE 1 TABLET BY MOUTH TWICE A DAY 60 tablet 2  . amLODipine (NORVASC) 2.5 MG tablet Take 1 tablet (2.5 mg total) by mouth daily. 30 tablet 1  . carvedilol (COREG) 3.125 MG tablet Take 3.125 mg by mouth 2 (two) times daily with a meal.    . ferrous sulfate 325 (65 FE) MG tablet Take 325 mg by mouth 2 (two) times daily with a meal.     .  furosemide (LASIX) 40 MG tablet Take 40 mg by mouth 2 (two) times daily.    Marland Kitchen HUMULIN N KWIKPEN 100 UNIT/ML Kiwkpen Inject 5 Units into the skin daily before breakfast.    . Multiple Vitamin (MULTIVITAMIN) capsule Take 1 capsule by mouth daily.    . tamsulosin (FLOMAX) 0.4 MG CAPS capsule Take 0.4 mg by mouth daily after supper.     . warfarin (COUMADIN) 2 MG tablet Take 1 tablet ( ) by mouth daily or as directed by Coumadin clinic. 30 tablet 1  . warfarin (COUMADIN) 5 MG tablet Take 2.5-5 mg by mouth See admin instructions. W-TH-F take 1 tablet SAT-SUN take 1/2 tablet    . potassium chloride 20 MEQ TBCR Take 1 tablet ( ) by mouth daily.  (Patient not taking: Reported on 06/06/2015) 30 tablet 1  . ranolazine (RANEXA) 500 MG 12 hr tablet Take 1 tablet (500 mg total) by mouth 2 (two) times daily. (Patient not taking: Reported on 06/06/2015) 60 tablet 6   No current facility-administered medications for this encounter.    Review of patient's allergies indicates no known allergies.  REVIEW OF SYSTEMS: All systems negative except as listed in HPI, PMH and Problem list.   Vital signs: HR: 103 MAP BP: 96 O2 Sat: 92; 93 Wt: 216.4 lbs  LVAD interrogation reveals:  Speed: 8800 Flow: 4.7 Power: 6.0 PI: 5.0 Alarms: Multiple  low voltage advisories Events: 2 - 5 PI events Fixed speed:  Low speed limit:    I reviewed the LVAD parameters from today, and compared the results to the patient's prior recorded data.  No programming changes were made.  The LVAD is functioning within specified parameters.  The patient performs LVAD self-test daily.  LVAD interrogation was negative for any significant power changes, alarms or PI events/speed drops.  LVAD equipment check completed and is in good working order.  Back-up equipment present.   LVAD education done on emergency procedures and precautions and reviewed exit site care.    Filed Vitals:   06/06/15 0957  BP: 110/0  Pulse: 70  Height:  (1.676 m)  Weight: 214 lb 3.2 oz (97.16 kg)  SpO2: 97%    Physical Exam: GENERAL: Well appearing, male who presents to clinic today in no acute distress. HEENT: normal  NECK: Supple, JVP 7  .  2+ bilaterally, no bruits.  No lymphadenopathy or thyromegaly appreciated.   CARDIAC:  Mechanical heart sounds with LVAD hum present.  LUNGS:  Clear to auscultation bilaterally.  ABDOMEN:  Soft, obese, round, nontender, positive bowel sounds x4.     LVAD exit site: well-healed and incorporated.  Dressing dry and intact.  No erythema or drainage.  Stabilization device present and accurately applied.  Driveline dressing is being changed  daily per sterile technique. EXTREMITIES:  Warm and dry, no cyanosis, clubbing, rash or edema  NEUROLOGIC:  Alert and oriented x 4.  Gait steady.  No aphasia.  No dysarthria.  Affect pleasant.  Strength normal in both LE. Cerebellar testing normal. Proprioception intact  ASSESSMENT AND PLAN:   1. Chronic systolic HF s/p HM-II LVAD -NYHA I symptoms. Volume status looks good 2. H/o  VT -Remains on amio 200 bid. VT quiescent 3. HM II- VAD -Driveline site looks good. VAD interrogation done personally and is OK except for multiple Low Voltage advisories. I counseled him to change batteries in a more timely fashion.  4.  Anticoagulation management - INR goal 2.0-3.0.  Will evaluate INR value today and follow up with  patient for necessary changes via his primary cardiology team in [place].  5. HTN  -BP well controlled 6. CAD - stable. No sx of ischemia. 7. Leg weakness --unclear etiology. Recent labs in ER and ICD interrogation OK. Will get PT to evaluate. May need referral to Neuro for further testing and NCS. I am also wondering if it may be related to amiodarone. Needs TFTs. May consider trial of decreasing amiodarone to 200 daily and see if this helps.   Jariah Tarkowski,MD 9:14 PM

## 2015-06-14 ENCOUNTER — Ambulatory Visit: Payer: Medicare Other | Admitting: Physical Therapy

## 2015-10-10 ENCOUNTER — Telehealth (HOSPITAL_COMMUNITY): Payer: Self-pay | Admitting: Infectious Diseases

## 2015-10-10 NOTE — Telephone Encounter (Signed)
Call received to request appointment with Dr. Gala RomneyBensimhon. Patient is share-care with [facility] and frequents this area. Last [facility] team 05/2015 after fall in ED from LE weakness. Primary complaint is lightheadedness and slurring of speech. Reports this is NOT an acute problem and is something that has been worsening gradually over the last few months. Denies any unilateral weakness or headaches. Reports weakness in BL LE's improved since doing 703m of physical therapy since we saw him in April. No medication changes recently reported. Home weights 198 - 202 lbs. Other symptoms reported are "eyes not focusing" well - this has been worsening since 2016 (Rx'd Amiodarone July 2016 for VT / DCCV). Pump parameters all reported to be baseline with PI 4.7-6.1. INRs have been in range of 1.8 - 2.0 (goal for him) with last reported to be 2.2.   I expressed concern for CVA however symptoms are chronic and progressive over months and he does not feel this is something that warrants ED evaluation. Clinic appointment scheduled for Wednesday 10/16/15 @ 12:00 as it is a holiday weekend. Advised that should symptoms progress/intensify at any point before visit or develops new symptoms concerning for CVA to page VAD pager and go to ED for evaluation. He verbalized understanding of the plan.   He has been maintained on Amiodarone and unsure of surveillance (eye exams, TSH/LFTs). Will contact his managing center next week to obtain last clinic note.

## 2015-10-16 ENCOUNTER — Ambulatory Visit (HOSPITAL_COMMUNITY)
Admission: RE | Admit: 2015-10-16 | Discharge: 2015-10-16 | Disposition: A | Discharge status: Home / Self Care | Payer: Medicare Other | Source: Ambulatory Visit | Attending: Cardiology | Admitting: Cardiology

## 2015-10-16 ENCOUNTER — Other Ambulatory Visit (HOSPITAL_COMMUNITY): Payer: Self-pay | Admitting: *Deleted

## 2015-10-16 VITALS — BP 111/86 | HR 70 | Ht 66.0 in | Wt 213.6 lb

## 2015-10-16 DIAGNOSIS — Z79899 Other long term (current) drug therapy: Secondary | ICD-10-CM

## 2015-10-16 DIAGNOSIS — Z95811 Presence of heart assist device: Secondary | ICD-10-CM | POA: Insufficient documentation

## 2015-10-16 DIAGNOSIS — D649 Anemia, unspecified: Secondary | ICD-10-CM | POA: Insufficient documentation

## 2015-10-16 DIAGNOSIS — I130 Hypertensive heart and chronic kidney disease with heart failure and stage 1 through stage 4 chronic kidney disease, or unspecified chronic kidney disease: Secondary | ICD-10-CM | POA: Diagnosis not present

## 2015-10-16 DIAGNOSIS — N184 Chronic kidney disease, stage 4 (severe): Secondary | ICD-10-CM | POA: Insufficient documentation

## 2015-10-16 DIAGNOSIS — R531 Weakness: Secondary | ICD-10-CM | POA: Diagnosis not present

## 2015-10-16 DIAGNOSIS — Z951 Presence of aortocoronary bypass graft: Secondary | ICD-10-CM | POA: Insufficient documentation

## 2015-10-16 DIAGNOSIS — I480 Paroxysmal atrial fibrillation: Secondary | ICD-10-CM | POA: Diagnosis not present

## 2015-10-16 DIAGNOSIS — I2510 Atherosclerotic heart disease of native coronary artery without angina pectoris: Secondary | ICD-10-CM | POA: Diagnosis not present

## 2015-10-16 DIAGNOSIS — Z7901 Long term (current) use of anticoagulants: Secondary | ICD-10-CM

## 2015-10-16 DIAGNOSIS — I252 Old myocardial infarction: Secondary | ICD-10-CM | POA: Insufficient documentation

## 2015-10-16 DIAGNOSIS — Z95810 Presence of automatic (implantable) cardiac defibrillator: Secondary | ICD-10-CM | POA: Diagnosis not present

## 2015-10-16 DIAGNOSIS — I5022 Chronic systolic (congestive) heart failure: Secondary | ICD-10-CM | POA: Diagnosis present

## 2015-10-16 DIAGNOSIS — Z9889 Other specified postprocedural states: Secondary | ICD-10-CM | POA: Insufficient documentation

## 2015-10-16 DIAGNOSIS — I472 Ventricular tachycardia, unspecified: Secondary | ICD-10-CM

## 2015-10-16 DIAGNOSIS — E1122 Type 2 diabetes mellitus with diabetic chronic kidney disease: Secondary | ICD-10-CM | POA: Insufficient documentation

## 2015-10-16 DIAGNOSIS — Z8673 Personal history of transient ischemic attack (TIA), and cerebral infarction without residual deficits: Secondary | ICD-10-CM | POA: Insufficient documentation

## 2015-10-16 DIAGNOSIS — R29898 Other symptoms and signs involving the musculoskeletal system: Secondary | ICD-10-CM | POA: Diagnosis not present

## 2015-10-16 DIAGNOSIS — R4781 Slurred speech: Secondary | ICD-10-CM | POA: Insufficient documentation

## 2015-10-16 LAB — COMPREHENSIVE METABOLIC PANEL
ALBUMIN: 3.2 g/dL — AB (ref 3.5–5.0)
ALK PHOS: 144 U/L — AB (ref 38–126)
ALT: 72 U/L — AB (ref 17–63)
AST: 104 U/L — ABNORMAL HIGH (ref 15–41)
Anion gap: 7 (ref 5–15)
BUN: 21 mg/dL — AB (ref 6–20)
CO2: 27 mmol/L (ref 22–32)
Calcium: 8.8 mg/dL — ABNORMAL LOW (ref 8.9–10.3)
Chloride: 104 mmol/L (ref 101–111)
Creatinine, Ser: 1.54 mg/dL — ABNORMAL HIGH (ref 0.61–1.24)
GFR calc non Af Amer: 42 mL/min — ABNORMAL LOW (ref >60)
GFR, EST AFRICAN AMERICAN: 48 mL/min — AB (ref >60)
Glucose, Bld: 142 mg/dL — ABNORMAL HIGH (ref 65–99)
POTASSIUM: 3.9 mmol/L (ref 3.5–5.1)
Sodium: 138 mmol/L (ref 135–145)
Total Bilirubin: 2 mg/dL — ABNORMAL HIGH (ref 0.3–1.2)
Total Protein: 5.9 g/dL — ABNORMAL LOW (ref 6.5–8.1)

## 2015-10-16 LAB — CBC
HEMATOCRIT: 37.2 % — AB (ref 39.0–52.0)
Hemoglobin: 11.5 g/dL — ABNORMAL LOW (ref 13.0–17.0)
MCH: 28 pg (ref 26.0–34.0)
MCHC: 30.9 g/dL (ref 30.0–36.0)
MCV: 90.5 fL (ref 78.0–100.0)
Platelets: 154 10*3/uL (ref 150–400)
RBC: 4.11 MIL/uL — AB (ref 4.22–5.81)
RDW: 17.7 % — ABNORMAL HIGH (ref 11.5–15.5)
WBC: 7.1 10*3/uL (ref 4.0–10.5)

## 2015-10-16 LAB — MAGNESIUM: Magnesium: 2.2 mg/dL (ref 1.7–2.4)

## 2015-10-16 LAB — LACTATE DEHYDROGENASE: LDH: 348 U/L — AB (ref 98–192)

## 2015-10-16 LAB — PROTIME-INR
INR: 2.5
Prothrombin Time: 27.5 seconds — ABNORMAL HIGH (ref 11.4–15.2)

## 2015-10-16 LAB — TSH: TSH: 10.919 u[IU]/mL — ABNORMAL HIGH (ref 0.350–4.500)

## 2015-10-16 NOTE — Progress Notes (Signed)
Symptom  Yes  No  Details   Angina   x Activity:   Claudication   x How far:   Syncope   x When:   Stroke   x   Orthopnea   x How many pillows:   PND   x How often:  CPAP   x How many hrs:   Pedal edema   x   Abd fullness   x   N&V   x   Diaphoresis   x When:  Bleeding  x   Urine color   yellow  SOB   x Activity:   Palpitations   x When:  ICD shock   x   Hospitlizaitons   x When/where/why:  ED visit   x When/where/why:  Other MD   x When/who/why:  Activity    Just completed 6 week rehab program at home. Increased activity; not using cane now.  Fluid    No limitations  Diet    No limitations   Vital signs: HR:  70 MAP BP:  108 modified systolic O2 Sat:  98 Wt:  213.6 lbs Last wt:  214.2 lbs Ht:  5'6"  LVAD interrogation reveals:  Speed:  8800 Flow:  ---; 4.5 Power:  5.8 PI:  6.0 Alarms:  Multiple low voltage advisories Events:   rare Fixed speed: 8800 Low speed limit:  8400   LVAD exit site:  Left drive line exit site well healed and incorporated. The velour is fully implanted at exit site. Small gauze dressing covered with Tegaderm dry and intact. Stabilization device present and accurately applied. Driveline dressing is being changed  weekly per sterile technique using guaze dressing and tegaderm per wife. Pt denies fever or chills. Pt/caregiver state they have adequate dressing supplies at home.  Patient has EPC controller EPC [id number]. History reveals high number of low voltage advisories  Occurring between 8 - 10 pm. Pt denies any alarms, states his batteries are lasting 10 - 12 hours per day. Batteries manufacture date 04/27/14 with 148 - 150 uses; all have been re-calibrated per pt/wife. Pt states he had annual maintenance completed with 20' patient cable replacement. Event log downloaded, will send to [employer] for review.   I reviewed the LVAD parameters from today, and compared the results to the patient's prior recorded data. No programming changes were made. The  LVAD is functioning within specified parameters.  LVAD interrogation was negative for any significant power changes or alarms; a few PI events/speed drops present which is consistent with the patient's history.    Pt/caregiver deny any alarms or VAD equipment issues. VAD coordinator reviewed daily log from home for daily temperature, weight, and VAD parameters. Pt is performing daily controller and system monitor self tests along with completing weekly and monthly maintenance for LVAD equipment.   LVAD equipment check completed and is in good working order. Back-up equipment present. LVAD education done on emergency procedures and precautions and reviewed exit site care.   Patient Instructions: 1.  No change in meds. 2.  Will call you with lab results. 3.  No scheduled f/u needed with us.  Hessie Dienereece, Rosie Torrez, RN

## 2015-10-17 LAB — T3, FREE: T3 FREE: 2.1 pg/mL (ref 2.0–4.4)

## 2015-10-17 LAB — T4: T4, Total: 13 ug/dL — ABNORMAL HIGH (ref 4.5–12.0)

## 2015-10-20 NOTE — Progress Notes (Signed)
Patient ID: William Huerta, male   DOB: 10-15-1940, 75 y.o.   MRN: [id number]  VAD CLINIC OUTPATIENT NOTE  PCP: [facility]   HPI:  William Huerta is a 75 y.o. 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   Presented to [facility] in July 2016 with dizziness and weakness. HR noted to be in the 140s with wide complex tachycardia. MAP preserved 80s-90s. Interrogation of his [employer] device has shown ventricular tachycardia with co-existing atrial fibrillation. EP saw patient, tried to pace him out of VT. Unable to terminate VT but HR decreased to 120s (slower VT). Then underwent DC-CV on 08/24/14. Amio increased from 100 bid to 200 bid.   Follow up for Heart Failure/LVAD: Presents to clinic today for acute visit given concern for recurrent CVA symptoms. Says occasionally his speech seems worse and he is worried his previous stroke might be getting worse. No other neuro sx - no weakness, sensory changes or visual problems..Otherwise he is doing ok. We referred him at last visit for home rehab due to weakness in his legs. Now much stronger.Mild exertional SOB. No CP, edema, orthopnea or PND. No events on ICD.    Denies LVAD alarms.  Denies driveline trauma, erythema or drainage.  Denies ICD shocks.   Reports taking Coumadin as prescribed and adherence to anticoagulation based dietary restrictions.  Denies bright red blood per rectum or melena, no dark urine or hematuria.     LVAD interrogation reveals:  Speed:  8800 Flow:  ---; 4.5 Power:  5.8 PI:  6.0 Alarms:  Multiple low voltage advisories Events:   rare Fixed speed: 8800 Low speed limit:  8400  I reviewed the LVAD parameters from today, and compared the results to the patient's prior recorded data.  No programming changes were made.  The LVAD is functioning within specified parameters.   The patient performs LVAD self-test daily.  LVAD interrogation was negative for any significant power changes, alarms or PI events/speed drops.  LVAD equipment check completed and is in good working order.  Back-up equipment present.   LVAD education done on emergency procedures and precautions and reviewed exit site care.    Past Medical History:  Diagnosis Date  . Anemia 06/2007  . Blood infection (HCC) 05/2007  . CAD (coronary artery disease)    a. s/p Quad bypass 07/1999.  Marland Kitchen Chronic systolic CHF (congestive heart failure) (HCC)    a. s/p HeartMate II LVAD 6.5 years at [facility] in [place].  . CKD (chronic kidney disease), stage IV (HCC)   . Diabetes mellitus (HCC)   . Heart attack (HCC) 04/1980  . History of cardioversion 12/2003  . History of quadruple bypass 07/1999   Quad Bypass, mitral valve repair  . LVAD (left ventricular assist device) present (HCC)   . PAF (paroxysmal atrial fibrillation) (HCC)    a. Recurrence 08/2013 with VT/co-existing atrial fib s/p DCCV 08/24/14.  . 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 S/P ablation of atrial fibrillation 05/2002  . S/P ICD (internal cardiac defibrillator) procedure    a. s/p [employer] ICD 07/1999.  Marland Kitchen Ventricular tachycardia (HCC)    a. Noted on interrogation 08/2013 with co-existing AF.    Current Outpatient Prescriptions  Medication Sig Dispense Refill  . acetaminophen (TYLENOL) 500 MG  tablet Take 1,000 mg by mouth every 8 (eight) hours as needed for moderate pain.     Marland Kitchen. amiodarone (PACERONE) 200 MG tablet TAKE 1 TABLET BY MOUTH TWICE A DAY 60 tablet 2  . amLODipine (NORVASC) 2.5 MG tablet Take 1 tablet (2.5 mg total) by mouth daily. 30 tablet 1  . carvedilol (COREG) 3.125 MG tablet Take 3.125 mg by mouth 2 (two) times daily with a meal.    . ferrous sulfate 325 (65 FE) MG tablet Take 325 mg by mouth 2 (two) times daily with a meal.     . furosemide (LASIX)  40 MG tablet Take 40 mg by mouth 2 (two) times daily.    Marland Kitchen. HUMULIN N KWIKPEN 100 UNIT/ML Kiwkpen Inject 5 Units into the skin daily before breakfast.    . Multiple Vitamin (MULTIVITAMIN) capsule Take 1 capsule by mouth daily.    . tamsulosin (FLOMAX) 0.4 MG CAPS capsule Take 0.4 mg by mouth daily after supper.     . warfarin (COUMADIN) 2 MG tablet Take 1 tablet (2mg ) by mouth daily or as directed by Coumadin clinic. (Patient not taking: Reported on 10/16/2015) 30 tablet 1  . warfarin (COUMADIN) 5 MG tablet Take 2.5-5 mg by mouth See admin instructions. W-TH-F take 1 tablet SAT-SUN take 1/2 tablet     No current facility-administered medications for this encounter.     Review of patient's allergies indicates no known allergies.  REVIEW OF SYSTEMS: All systems negative except as listed in HPI, PMH and Problem list.   Vital signs: HR:  70 MAP BP:  108 modified systolic O2 Sat:  98 Wt:  213.6 lbs Last wt:  214.2 lbs Ht:  5'6"  Vitals:   10/16/15 1215 10/16/15 1216  BP: (!) 108/0 111/86  Pulse:  70  SpO2:  98%  Weight:  213 lb 9.6 oz (96.9 kg)  Height:  5\' 6"  (1.676 m)    Physical Exam: GENERAL: Well appearing, male who presents to clinic today in no acute distress. HEENT: normal  NECK: Supple, JVP 76-7  .  2+ bilaterally, no bruits.  No lymphadenopathy or thyromegaly appreciated.   CARDIAC:  Mechanical heart sounds with LVAD hum present.  LUNGS:  Clear to auscultation bilaterally.  ABDOMEN:  Soft, obese, round, nontender, positive bowel sounds x4.     LVAD exit site: well-healed and incorporated.  Dressing dry and intact.  No erythema or drainage.  Stabilization device present and accurately applied.  Driveline dressing is being changed daily per sterile technique. EXTREMITIES:  Warm and dry, no cyanosis, clubbing, rash or edema  NEUROLOGIC:  Alert and oriented x 4.  Gait steady.  No aphasia.  No dysarthria.  Affect pleasant.  Strength normal in both LE. Cerebellar testing normal.  Proprioception intact  ASSESSMENT AND PLAN:   1. Chronic systolic HF s/p HM-II LVAD -NYHA I symptoms. Volume status looks good 2. H/o  VT - ICD interrogated personally. VT quiescent. Remains on amio 200 bid.  3. HM II- VAD -Driveline site looks good. VAD interrogation done personally and is OK except for multiple Low Voltage advisories. I counseled him to change batteries in a more timely fashion.  4.  Anticoagulation management - INR goal 2.0-3.0.  Will evaluate INR value today and follow up with patient for necessary changes via his primary cardiology team in [place].  5. HTN  -BP well controlled 6. CAD - stable. No sx of ischemia. 7. Leg weakness - improved with recent rehab. Encouraged him to continue  strengthening exercises with bands.  8. Slurred speech - neuro symptoms stable today. No evidence of acute abnormality. LDH and INR ok. Reassured him.   William Smigel,MD 1:16 AM

## 2015-10-22 ENCOUNTER — Other Ambulatory Visit (HOSPITAL_COMMUNITY): Payer: Self-pay | Admitting: *Deleted

## 2015-10-22 ENCOUNTER — Telehealth (HOSPITAL_COMMUNITY): Payer: Self-pay | Admitting: *Deleted

## 2015-10-22 DIAGNOSIS — I472 Ventricular tachycardia, unspecified: Secondary | ICD-10-CM

## 2015-10-22 DIAGNOSIS — E0590 Thyrotoxicosis, unspecified without thyrotoxic crisis or storm: Secondary | ICD-10-CM

## 2015-10-22 DIAGNOSIS — Z79899 Other long term (current) drug therapy: Secondary | ICD-10-CM

## 2015-10-22 DIAGNOSIS — I480 Paroxysmal atrial fibrillation: Secondary | ICD-10-CM

## 2015-10-22 MED ORDER — AMIODARONE HCL 200 MG PO TABS: 200.0000 mg | ORAL_TABLET | Freq: Every day | ORAL | 2 refills | Status: AC

## 2015-10-22 NOTE — Telephone Encounter (Signed)
Contacted Stacy at [facility] Georganna Skeans(Sherri Lee out of office this week and next) and Victorino DikeJennifer, NP at [facility] re: pt's clinic visit last week. Clinic note, labs, and [employer] response to VAD event log analysis sent to both [facility] (pt share cares here) and [facility] clinic (implanting center).  Discussed plan of care with Victorino DikeJennifer and Dr. Gala RomneyBensimhon with following recommendations.   1.  Decrease Amiodarone to 200 mg daily.  2.  Have patient come in for controller change out.   3.  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  Called pt with above plan, he wants to keep EPC controller and not upgrade to pocket controller. We will order EPC and call pt when it arrives for controller change out.  Instructed pt to decrease amiodarone to 200 mg daily.  Pt will be in [place] until mid October, will order endocrinology referral with [facility] per Dr. Gala RomneyBensimhon.   Spoke with Kennyth ArnoldStacy [facility] and ask if she could check with Dr. Kathlene NovemberMcCormick there for endocrinology referral and schedule for mid Oct when patient returns home. She will check with Dr. Kathlene NovemberMcCormick.   Called patient and updated him with above plan. We will call him when [facility] controller arrives (ordered today) and perform controller change out. Advised pt to check INR weekly due to decreased amiodarone dose.  Pt has initial endocrinology appointment scheduled tomorrow at 4:00 pm with Dr. Everardo AllEllison; asked him to arrive at 3:45 pm at:  [facility]   [address]  [place], [place] [address]  [phone]    Pt verbalized understanding of above.

## 2015-10-23 ENCOUNTER — Ambulatory Visit (INDEPENDENT_AMBULATORY_CARE_PROVIDER_SITE_OTHER): Payer: Medicare Other | Admitting: Endocrinology

## 2015-10-23 ENCOUNTER — Encounter: Payer: Self-pay | Admitting: Endocrinology

## 2015-10-23 DIAGNOSIS — E032 Hypothyroidism due to medicaments and other exogenous substances: Secondary | ICD-10-CM

## 2015-10-23 MED ORDER — LEVOTHYROXINE SODIUM 50 MCG PO TABS: 50.0000 ug | ORAL_TABLET | Freq: Every day | ORAL | 11 refills | Status: AC

## 2015-10-23 NOTE — Patient Instructions (Addendum)
You can continue the amiodarone--we'll work around that.   I have sent a prescription to your pharmacy, for a thyroid hormone pill.   Please come back for a follow-up appointment in 1 month.

## 2015-10-23 NOTE — Progress Notes (Signed)
Subjective:    Patient ID: William Huerta, male    DOB: [birth year], 75 y.o.   MRN: [id number]  HPI Pt reports he has been on amiodarone since 2016.  He was found to have elevated TSH.  He has no h/o thyroid problems.  He has never taken kelp or any other type of non-prescribed thyroid product.  He has never had thyroid imaging.  He has never had thyroid surgery, or XRT to the neck.  He has never been on lithium.  He has slight dizziness sensation in the head, and assoc fatigue.   Past Medical History:  Diagnosis Date  . Anemia 06/2007  . Blood infection (HCC) 05/2007  . CAD (coronary artery disease)    a. s/p Quad bypass 07/1999.  Marland Kitchen Chronic systolic CHF (congestive heart failure) (HCC)    a. 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  . CKD (chronic kidney disease), stage IV (HCC)   . Diabetes mellitus (HCC)   . Heart attack (HCC) 04/1980  . History of cardioversion 12/2003  . History of quadruple bypass 07/1999   Quad Bypass, mitral valve repair  . LVAD (left ventricular assist device) present (HCC)   . PAF (paroxysmal atrial fibrillation) (HCC)    a. Recurrence 08/2013 with VT/co-existing atrial fib s/p DCCV 08/24/14.  . 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 S/P ablation of atrial fibrillation 05/2002  . S/P ICD (internal cardiac defibrillator) procedure    a. s/p [employer] ICD 07/1999.  Marland Kitchen Ventricular tachycardia (HCC)    a. Noted on interrogation 08/2013 with co-existing AF.    Past Surgical History:  Procedure Laterality Date  . CARDIOVERSION N/A 08/24/2014   Procedure: CARDIOVERSION;  Surgeon: Laurey Morale, MD;  Location: [facility] ENDOSCOPY;  Service: Cardiovascular;  Laterality: N/A;  . COLONOSCOPY  05/29/2010  . DOBUTAMINE STRESS ECHO  08/23/2012  . HERNIA MESH REMOVAL  06/20/13    Social History   Social History  . Marital status: Married    Spouse name: N/A  . Number of children:  N/A  . Years of education: N/A   Occupational History  . Not on file.   Social History Main Topics  . Smoking status: Never Smoker  . Smokeless tobacco: Not on file  . Alcohol use No  . Drug use: No  . Sexual activity: Not on file   Other Topics Concern  . Not on file   Social History Narrative  . No narrative on file    Current Outpatient Prescriptions on File Prior to Visit  Medication Sig Dispense Refill  . acetaminophen (TYLENOL) 500 MG tablet Take 1,000 mg by mouth every 8 (eight) hours as needed for moderate pain.     Marland Kitchen amiodarone (PACERONE) 200 MG tablet Take 1 tablet (200 mg total) by mouth daily. 60 tablet 2  . amLODipine (NORVASC) 2.5 MG tablet Take 1 tablet (2.5 mg total) by mouth daily. 30 tablet 1  . carvedilol (COREG) 3.125 MG tablet Take 3.125 mg by mouth 2 (two) times daily with a meal.    . ferrous sulfate 325 (65 FE) MG tablet Take 325 mg by mouth 2 (two) times daily with a meal.     . furosemide (LASIX) 40 MG tablet Take 40 mg by mouth 2 (two) times daily.    Marland Kitchen HUMULIN N KWIKPEN 100 UNIT/ML Kiwkpen Inject 5 Units into the skin daily before breakfast.    . Multiple  Vitamin (MULTIVITAMIN) capsule Take 1 capsule by mouth daily.    . tamsulosin (FLOMAX) 0.4 MG CAPS capsule Take 0.4 mg by mouth daily after supper.     . warfarin (COUMADIN) 2 MG tablet Take 1 tablet (2mg ) by mouth daily or as directed by Coumadin clinic. 30 tablet 1  . warfarin (COUMADIN) 5 MG tablet Take 2.5-5 mg by mouth See admin instructions. W-TH-F take 1 tablet SAT-SUN take 1/2 tablet     No current facility-administered medications on file prior to visit.     No Known Allergies  Family History  Problem Relation Age of Onset  . Heart disease Sister   . Diabetes Brother   . Heart disease Brother   . Thyroid disease Brother   . Heart disease Brother     BP 114/62   Pulse 70   Ht 5\' 6"  (1.676 m)   Wt 211 lb (95.7 kg)   SpO2 97%   BMI 34.06 kg/m   Review of Systems denies muscle  cramps, sob, weight gain, memory loss, constipation, double vision, cold intolerance,  rhinorrhea, and syncope.  He attributes easy bruising to coumadin.  He has dry skin.  He has slight numbness of the feet.      Objective:   Physical Exam VS: see vs page GEN: no distress HEAD: head: no deformity eyes: no periorbital swelling, no proptosis external nose and ears are normal mouth: no lesion seen NECK: supple, thyroid is not enlarged CHEST WALL: no deformity LUNGS: clear to auscultation CV: reg rate and rhythm, no murmur ABD: abdomen is soft, nontender.  no hepatosplenomegaly.  not distended.  no hernia MUSCULOSKELETAL: muscle bulk and strength are grossly normal.  no obvious joint swelling.  gait is normal and steady EXTEMITIES: no deformity.  no ulcer on the feet.  feet are of normal color and temp.  no edema PULSES: dorsalis pedis intact bilat.  no carotid bruit NEURO:  cn 2-12 grossly intact.   readily moves all 4's.  sensation is intact to touch on the feet SKIN:  Normal texture and temperature.  No rash or suspicious lesion is visible.   NODES:  None palpable at the neck PSYCH: alert, well-oriented.  Does not appear anxious nor depressed.    Lab Results  Component Value Date   TSH 10.919 (H) 10/16/2015   T4TOTAL 13.0 (H) 10/16/2015    I have reviewed outside records, and summarized: Pt was noted to have elevated TSH, and ref here.  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 VT: in this setting, he does not needs adjustment or discontinuation of amiodarone, due to thyroid.

## 2015-10-24 DIAGNOSIS — E039 Hypothyroidism, unspecified: Secondary | ICD-10-CM | POA: Insufficient documentation

## 2015-11-01 ENCOUNTER — Ambulatory Visit (HOSPITAL_COMMUNITY)
Admission: RE | Admit: 2015-11-01 | Discharge: 2015-11-01 | Disposition: A | Discharge status: Home / Self Care | Payer: Medicare Other | Source: Ambulatory Visit | Attending: Cardiology | Admitting: Cardiology

## 2015-11-01 DIAGNOSIS — Z95811 Presence of heart assist device: Secondary | ICD-10-CM

## 2015-11-01 DIAGNOSIS — Z45010 Encounter for checking and testing of cardiac pacemaker pulse generator [battery]: Secondary | ICD-10-CM | POA: Diagnosis not present

## 2015-11-01 NOTE — Progress Notes (Signed)
Reports today with his wife for elective controller exchange due to numerous Low Voltage Advisories noted during log file while on batteries and patient cable. Patient tolerated controller exchange well while lying supine and was asymptomatic. Pump restarted as expected with stable VAD parameters on correct prescribed speed of 8800 / 8400 rpms.    New Controller: [id number] Manufactured: 12/14/14 Expiration: 01/08/18   Rexene AlbertsStephanie Dartanyon Frankowski, RN VAD Coordinator   Office: [phone] 24/7 Emergency VAD Pager: [phone]

## 2015-11-17 ENCOUNTER — Encounter (HOSPITAL_COMMUNITY): Payer: Self-pay | Admitting: Emergency Medicine

## 2015-11-17 ENCOUNTER — Emergency Department (HOSPITAL_COMMUNITY)
Admission: EM | Admit: 2015-11-17 | Discharge: 2015-11-17 | Disposition: A | Discharge status: Home / Self Care | Payer: Medicare Other | Attending: Emergency Medicine | Admitting: Emergency Medicine

## 2015-11-17 DIAGNOSIS — Z7901 Long term (current) use of anticoagulants: Secondary | ICD-10-CM | POA: Diagnosis not present

## 2015-11-17 DIAGNOSIS — E039 Hypothyroidism, unspecified: Secondary | ICD-10-CM | POA: Diagnosis not present

## 2015-11-17 DIAGNOSIS — R42 Dizziness and giddiness: Secondary | ICD-10-CM | POA: Insufficient documentation

## 2015-11-17 DIAGNOSIS — N184 Chronic kidney disease, stage 4 (severe): Secondary | ICD-10-CM | POA: Insufficient documentation

## 2015-11-17 DIAGNOSIS — E1122 Type 2 diabetes mellitus with diabetic chronic kidney disease: Secondary | ICD-10-CM | POA: Diagnosis not present

## 2015-11-17 DIAGNOSIS — Z794 Long term (current) use of insulin: Secondary | ICD-10-CM | POA: Diagnosis not present

## 2015-11-17 DIAGNOSIS — I5022 Chronic systolic (congestive) heart failure: Secondary | ICD-10-CM | POA: Insufficient documentation

## 2015-11-17 DIAGNOSIS — I2510 Atherosclerotic heart disease of native coronary artery without angina pectoris: Secondary | ICD-10-CM | POA: Insufficient documentation

## 2015-11-17 DIAGNOSIS — Z95810 Presence of automatic (implantable) cardiac defibrillator: Secondary | ICD-10-CM | POA: Diagnosis not present

## 2015-11-17 LAB — CBC WITH DIFFERENTIAL/PLATELET
Basophils Absolute: 0 10*3/uL (ref 0.0–0.1)
Basophils Relative: 0 %
Eosinophils Absolute: 0.1 10*3/uL (ref 0.0–0.7)
Eosinophils Relative: 2 %
HCT: 33.7 % — ABNORMAL LOW (ref 39.0–52.0)
Hemoglobin: 10.1 g/dL — ABNORMAL LOW (ref 13.0–17.0)
Lymphocytes Relative: 10 %
Lymphs Abs: 0.6 10*3/uL — ABNORMAL LOW (ref 0.7–4.0)
MCH: 27.6 pg (ref 26.0–34.0)
MCHC: 30 g/dL (ref 30.0–36.0)
MCV: 92.1 fL (ref 78.0–100.0)
Monocytes Absolute: 0.5 10*3/uL (ref 0.1–1.0)
Monocytes Relative: 9 %
Neutro Abs: 4.4 10*3/uL (ref 1.7–7.7)
Neutrophils Relative %: 79 %
Platelets: 131 10*3/uL — ABNORMAL LOW (ref 150–400)
RBC: 3.66 MIL/uL — ABNORMAL LOW (ref 4.22–5.81)
RDW: 18.1 % — ABNORMAL HIGH (ref 11.5–15.5)
WBC: 5.6 10*3/uL (ref 4.0–10.5)

## 2015-11-17 LAB — BASIC METABOLIC PANEL
Anion gap: 6 (ref 5–15)
BUN: 18 mg/dL (ref 6–20)
CO2: 26 mmol/L (ref 22–32)
Calcium: 8.1 mg/dL — ABNORMAL LOW (ref 8.9–10.3)
Chloride: 108 mmol/L (ref 101–111)
Creatinine, Ser: 1.44 mg/dL — ABNORMAL HIGH (ref 0.61–1.24)
GFR calc Af Amer: 53 mL/min — ABNORMAL LOW (ref >60)
GFR calc non Af Amer: 45 mL/min — ABNORMAL LOW (ref >60)
Glucose, Bld: 118 mg/dL — ABNORMAL HIGH (ref 65–99)
Potassium: 3.4 mmol/L — ABNORMAL LOW (ref 3.5–5.1)
Sodium: 140 mmol/L (ref 135–145)

## 2015-11-17 LAB — PROTIME-INR
INR: 2.04
Prothrombin Time: 23.3 seconds — ABNORMAL HIGH (ref 11.4–15.2)

## 2015-11-17 LAB — LACTATE DEHYDROGENASE: LDH: 277 U/L — ABNORMAL HIGH (ref 98–192)

## 2015-11-17 MED ORDER — MECLIZINE HCL 12.5 MG PO TABS: 12.5000 mg | ORAL_TABLET | Freq: Three times a day (TID) | ORAL | 0 refills | Status: AC | PRN

## 2015-11-17 MED ORDER — POTASSIUM CHLORIDE CRYS ER 20 MEQ PO TBCR
20.0000 meq | EXTENDED_RELEASE_TABLET | Freq: Once | ORAL | Status: AC
  Administered 2015-11-17: 20 meq via ORAL
  Filled 2015-11-17: qty 1

## 2015-11-17 MED ORDER — MECLIZINE HCL 25 MG PO TABS
12.5000 mg | ORAL_TABLET | Freq: Once | ORAL | Status: AC
  Administered 2015-11-17: 12.5 mg via ORAL
  Filled 2015-11-17: qty 1

## 2015-11-17 NOTE — ED Triage Notes (Signed)
Pt states when he woke up this am he had a headache and upon getting up he, "felt like the room was spinning and I almost smashed into the wall". Pt denies falling of hitting head.

## 2015-11-17 NOTE — ED Notes (Signed)
Family at bedside. 

## 2015-11-17 NOTE — ED Notes (Signed)
Meal tray ordered for pt  

## 2015-11-17 NOTE — ED Notes (Signed)
Pt on bedside LVAD machine. Numbers read to stephanie Heart Failure nurse and within normal limits for pt and no alarms recorded.

## 2015-11-17 NOTE — ED Provider Notes (Signed)
[facility]-EMERGENCY DEPT Provider Note   CSN: [id number] Arrival date & time: 11/17/15  0755     History   Chief Complaint Chief Complaint  Patient presents with  . Dizziness    HPI William Huerta is a 75 y.o. male.  HPI   75 year old male with dizziness. Symptom onset this morning when he went to get up out of bed. He had the sensation of room spinning and he felt very off balance. He stumbled that the wall, but did not fall. Symptoms improved when still. Exacerbated again with movement. He has an LVAD. He denies any alarms. When he woke up this morning is otherwise in his usual state of health. No fevers or chills. No shortness of breath.  Speed: 8800 Flow: 4.7 Power: 5.8 PI: 5.3   Past Medical History:  Diagnosis Date  . Anemia 06/2007  . Blood infection (HCC) 05/2007  . CAD (coronary artery disease)    a. s/p Quad bypass 07/1999.  Marland Kitchen. Chronic systolic CHF (congestive heart failure) (HCC)    a. s/p HeartMate II LVAD 6.[facility] in [place].  . CKD (chronic kidney disease), stage IV (HCC)   . Diabetes mellitus (HCC)   . Heart attack 04/1980  . History of cardioversion 12/2003  . History of quadruple bypass 07/1999   Quad Bypass, mitral valve repair  . LVAD (left ventricular assist device) present (HCC)   . PAF (paroxysmal atrial fibrillation) (HCC)    a. Recurrence 08/2013 with VT/co-existing atrial fib s/p DCCV 08/24/14.  . 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 S/P ablation of atrial fibrillation 05/2002  . S/P ICD (internal cardiac defibrillator) procedure    a. s/p [employer] ICD 07/1999.  Marland Kitchen. Ventricular tachycardia (HCC)    a. Noted on interrogation 08/2013 with co-existing AF.    Patient Active Problem List   Diagnosis Date Noted  . Hypothyroidism 10/24/2015  . Leg weakness, bilateral 06/09/2015  . CKD (chronic kidney disease) stage 4, GFR 15-29 ml/min (HCC) 08/23/2014  .  V-tach (HCC) 08/23/2014  . Chronic systolic heart failure (HCC) 09/05/2013  . LVAD (left ventricular assist device) present (HCC) 09/05/2013    Past Surgical History:  Procedure Laterality Date  . CARDIOVERSION N/A 08/24/2014   Procedure: CARDIOVERSION;  Surgeon: Laurey Moralealton S McLean, MD;  [facility];  Service: Cardiovascular;  Laterality: N/A;  . COLONOSCOPY  05/29/2010  . DOBUTAMINE STRESS ECHO  08/23/2012  . HERNIA MESH REMOVAL  06/20/13       Home Medications    Prior to Admission medications   Medication Sig Start Date End Date Taking? Authorizing Provider  acetaminophen (TYLENOL) 500 MG tablet Take 1,000 mg by mouth every 8 (eight) hours as needed for moderate pain.     Historical Provider, MD  amiodarone (PACERONE) 200 MG tablet Take 1 tablet (200 mg total) by mouth daily. 10/22/15   Bevelyn Bucklesaniel R Bensimhon, MD  amLODipine (NORVASC) 2.5 MG tablet Take 1 tablet (2.5 mg total) by mouth daily. 08/25/14   Dayna N Dunn, PA-C  carvedilol (COREG) 3.125 MG tablet Take 3.125 mg by mouth 2 (two) times daily with a meal.    Historical Provider, MD  ferrous sulfate 325 (65 FE) MG tablet Take 325 mg by mouth 2 (two) times daily with a meal.     Historical Provider, MD  furosemide (LASIX) 40 MG tablet Take 40 mg by mouth 2 (two) times daily.    Historical Provider, MD  HUMULIN N KWIKPEN 100 UNIT/ML Kiwkpen Inject 5 Units into the skin daily before breakfast. 05/22/14   Historical Provider, MD  levothyroxine (SYNTHROID, LEVOTHROID) 50 MCG tablet Take 1 tablet (50 mcg total) by mouth daily before breakfast. 10/23/15   Romero Belling, MD  Multiple Vitamin (MULTIVITAMIN) capsule Take 1 capsule by mouth daily.    Historical Provider, MD  tamsulosin (FLOMAX) 0.4 MG CAPS capsule Take 0.4 mg by mouth daily after supper.     Historical Provider, MD  warfarin (COUMADIN) 2 MG tablet Take 1 tablet (2mg ) by mouth daily or as directed by Coumadin clinic. 08/25/14   Dayna N Dunn, PA-C  warfarin (COUMADIN) 5 MG  tablet Take 2.5-5 mg by mouth See admin instructions. W-TH-F take 1 tablet SAT-SUN take 1/2 tablet    Historical Provider, MD    Family History Family History  Problem Relation Age of Onset  . Heart disease Sister   . Diabetes Brother   . Heart disease Brother   . Thyroid disease Brother   . Heart disease Brother     Social History Social History  Substance Use Topics  . Smoking status: Never Smoker  . Smokeless tobacco: Not on file  . Alcohol use No     Allergies   Review of patient's allergies indicates no known allergies.   Review of Systems Review of Systems  All systems reviewed and negative, other than as noted in HPI. Physical Exam Updated Vital Signs Pulse 70   Temp 98.5 F (36.9 C) (Oral)   Resp 25   Ht 5\' 6"  (1.676 m)   Wt 200 lb (90.7 kg)   SpO2 98%   BMI 32.28 kg/m   Physical Exam  Constitutional: He appears well-developed and well-nourished. No distress.  HENT:  Head: Normocephalic and atraumatic.  Eyes: Conjunctivae are normal. Right eye exhibits no discharge. Left eye exhibits no discharge.  Neck: Neck supple.  Cardiovascular: Exam reveals no gallop and no friction rub.   No murmur heard. Continuous hum of lvad  Pulmonary/Chest: Effort normal and breath sounds normal. No respiratory distress.  Abdominal: Soft. He exhibits no distension. There is no tenderness.  Drive site looks fine  Musculoskeletal: He exhibits no edema or tenderness.  Neurological: He is alert.  Skin: Skin is warm and dry.  Psychiatric: He has a normal mood and affect. His behavior is normal. Thought content normal.  Nursing note and vitals reviewed.    ED Treatments / Results  Labs (all labs ordered are listed, but only abnormal results are displayed) Labs Reviewed  CBC WITH DIFFERENTIAL/PLATELET - Abnormal; Notable for the following:       Result Value   RBC 3.66 (*)    Hemoglobin 10.1 (*)    HCT 33.7 (*)    RDW 18.1 (*)    Platelets 131 (*)    Lymphs Abs 0.6  (*)    All other components within normal limits  BASIC METABOLIC PANEL - Abnormal; Notable for the following:    Potassium 3.4 (*)    Glucose, Bld 118 (*)    Creatinine, Ser 1.44 (*)    Calcium 8.1 (*)    GFR calc non Af Amer 45 (*)    GFR calc Af Amer 53 (*)    All other components within normal limits  PROTIME-INR - Abnormal; Notable for the following:    Prothrombin Time 23.3 (*)    All other components within normal limits  LACTATE DEHYDROGENASE - Abnormal; Notable for the following:  LDH 277 (*)    All other components within normal limits    EKG  EKG Interpretation  Date/Time:  Sunday November 17 2015 08:04:28 EDT Ventricular Rate:  70 PR Interval:    QRS Duration: 132 QT Interval:  668 QTC Calculation: 722 R Axis:   -115 Text Interpretation:  VENTRICULAR PACED RHYTHM Confirmed by Juleen China  MD, Sherriann Szuch [phone]) on 11/17/2015 9:53:10 AM       Radiology No results found.  Procedures Procedures (including critical care time)  Medications Ordered in ED Medications - No data to display   Initial Impression / Assessment and Plan / ED Course  I have reviewed the triage vital signs and the nursing notes.  Pertinent labs & imaging results that were available during my care of the patient were reviewed by me and considered in my medical decision making (see chart for details).  Clinical Course    75yM with dizziness. LVAD pt, but his symptoms seem more consistent with vertigo. Describes "room spinning." Positional. Recent "popping" sensation and "buzzing" in R ear. These symptoms are consistent with peripheral vertigo and less likely cardiac in origin, symptomatic anemia, TIA/CVA, etc. Asymptomatic currently. Exam nonfocal. Reports no recent alarms. LVAD parameters seem fine. Well appearing.  Afebrile.   Final Clinical Impressions(s) / ED Diagnoses   Final diagnoses:  Vertigo    New Prescriptions New Prescriptions   No medications on file     Raeford Razor,  MD 11/20/15 1215

## 2015-11-22 ENCOUNTER — Ambulatory Visit: Payer: Medicare Other | Admitting: Endocrinology

## 2015-11-22 ENCOUNTER — Telehealth (HOSPITAL_COMMUNITY): Payer: Self-pay | Admitting: Infectious Diseases

## 2015-11-22 NOTE — Telephone Encounter (Signed)
Called to see how he is feeling after being in ED this weekend for vertigo. His hemoglobin has decreased slowly over last few months. He is on PO iron currently. Called to discuss taking iron with vitamin C to enhance absorption and to offer for a recheck in a few weeks along with FOBT to check for GIB.   Voicemail left requesting callback to [facility] office.

## 2016-06-09 DEATH — deceased

## 2016-10-22 ENCOUNTER — Encounter (HOSPITAL_COMMUNITY): Payer: Self-pay | Admitting: *Deleted

## 2016-12-23 IMAGING — CR DG CHEST 1V PORT
1 series · 1 of 1 positions shown · non-contrast
Comparison: September 05, 2013

CLINICAL DATA: One month of productive cough. One day of weakness
and dizziness.

EXAM:
PORTABLE CHEST - 1 VIEW

[AP]
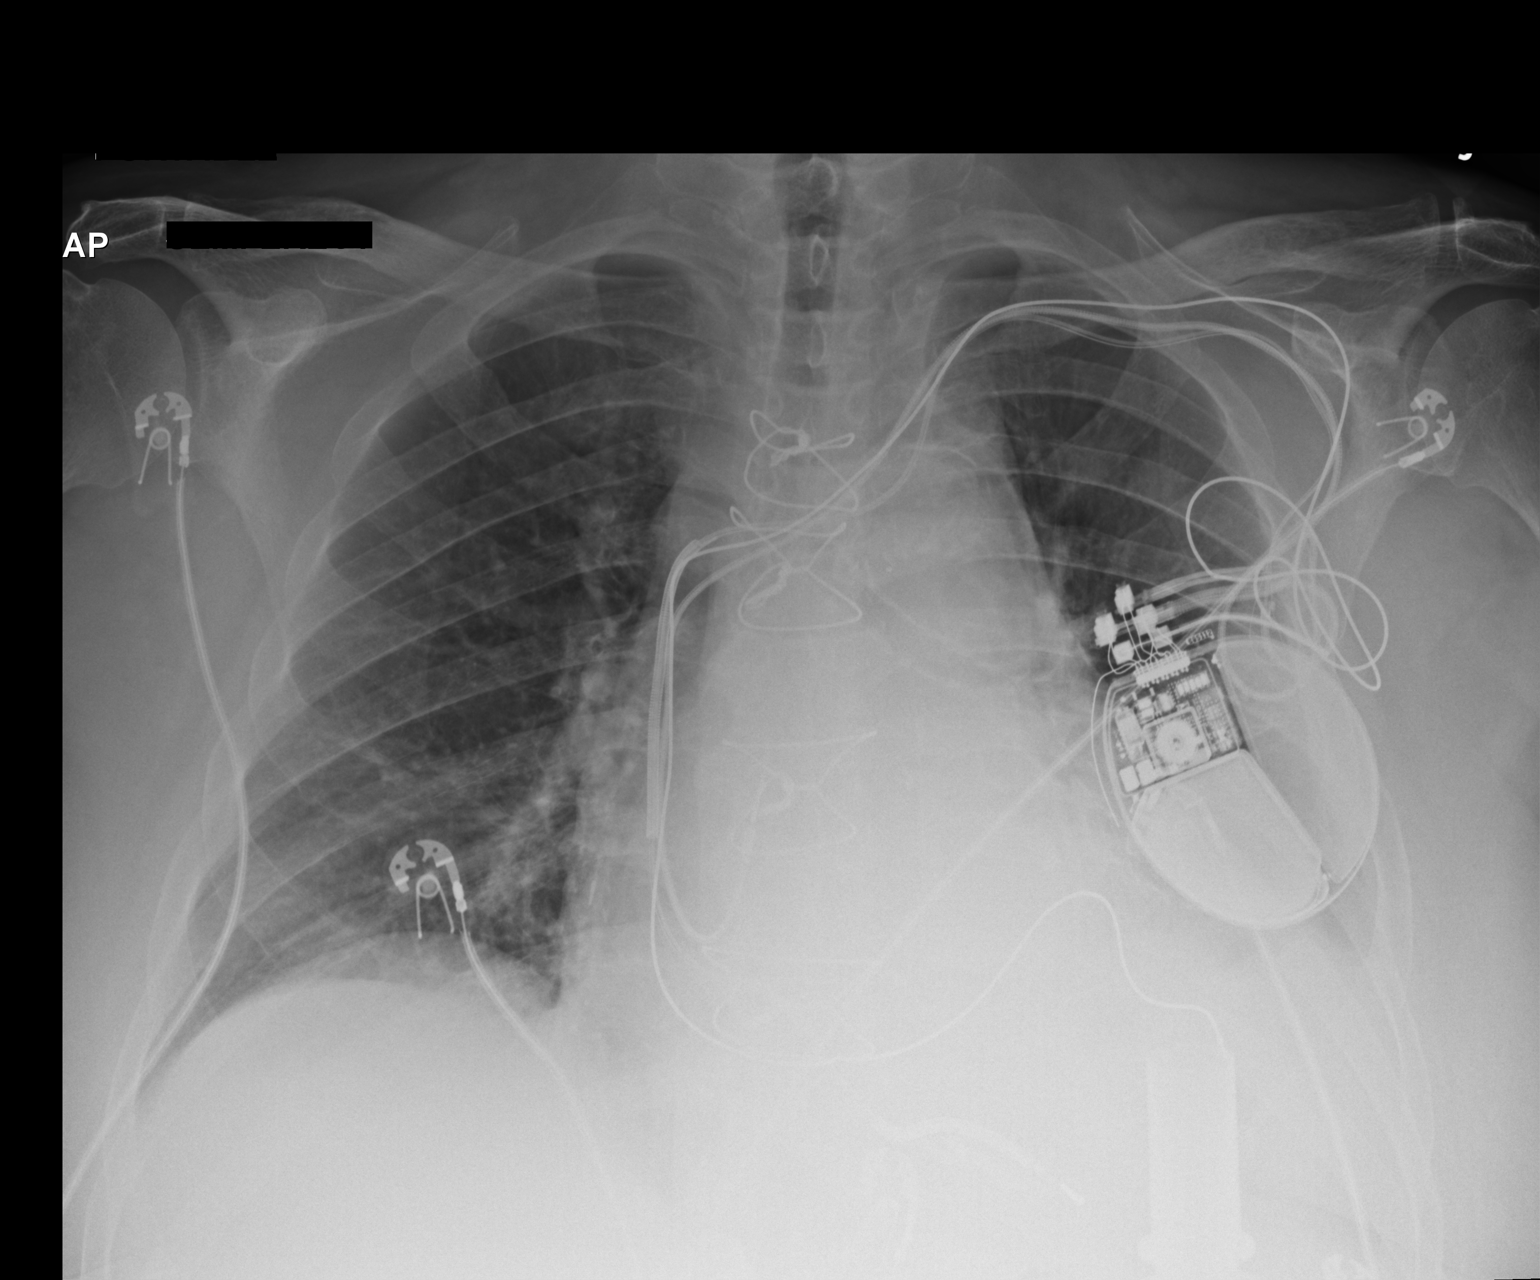

[1 of 1 positions shown; findings below may reference images not displayed]

FINDINGS: There is airspace consolidation in the left base. Lungs elsewhere
clear. Heart is mildly enlarged with pulmonary vascularity within
normal limits. There is a left ventricular assist device present.
Pacemaker leads are attached to the right atrium and right
ventricle. No adenopathy.
IMPRESSION: Left lower lobe consolidation. Lungs elsewhere clear. Stable cardiac
enlargement. Stable pacemaker lead positioning as well as left
ventricular assist device present. Followup PA and lateral chest
radiographs recommended in 3-4 weeks following trial of antibiotic
therapy to ensure resolution and exclude underlying malignancy.

## 2017-09-24 IMAGING — CR DG CHEST 1V PORT
1 series · 1 of 1 positions shown · non-contrast
Comparison: Chest x-ray a 08/23/2014.

CLINICAL DATA: 74-year-old male with weakness. Fall. History of
LVAD.

EXAM:
PORTABLE CHEST 1 VIEW

[AP]
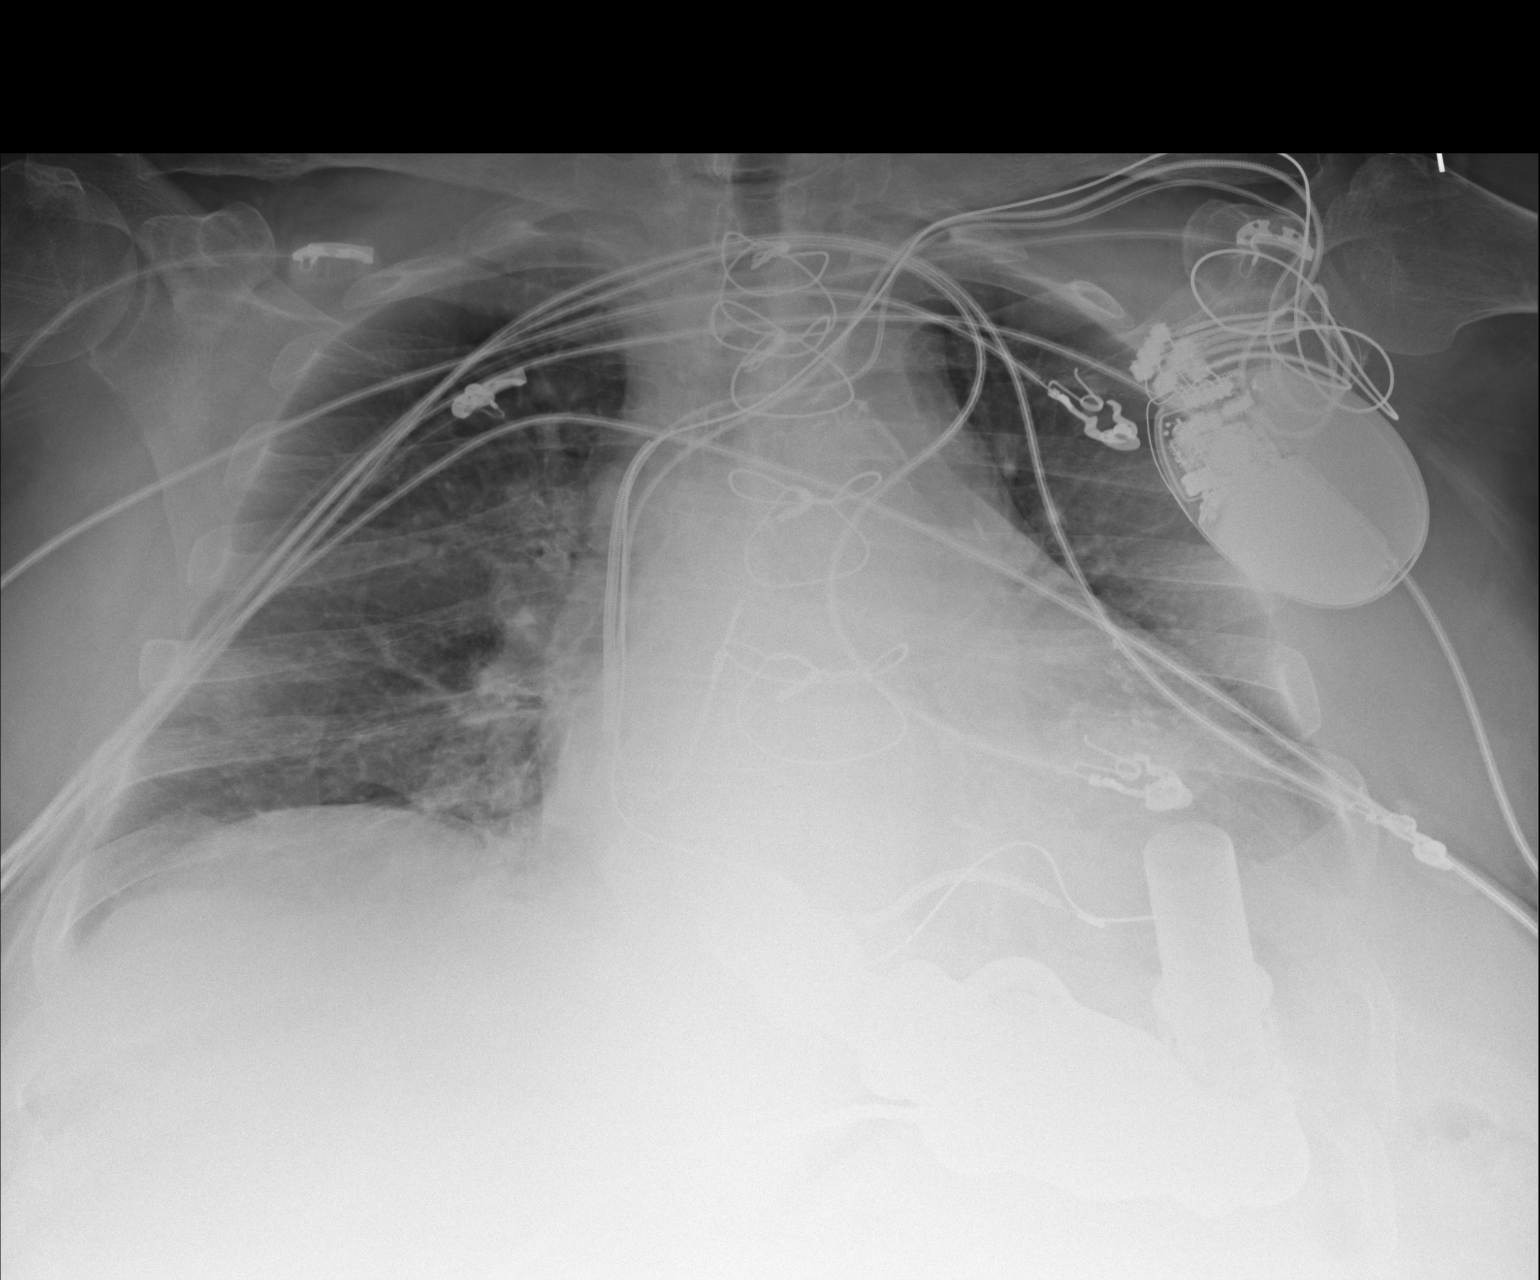

[1 of 1 positions shown; findings below may reference images not displayed]

FINDINGS: Lung volumes are very low. Opacity at the left base favored to be
secondary to underpenetration of the film, although some left
basilar atelectasis may also be present. No definite pleural
effusions. No evidence of pulmonary edema. Heart size is mildly
enlarged. Upper mediastinal contours are within normal limits.
Atherosclerosis in the thoracic aorta. Status post median
sternotomy. Left ventricular assist device noted. Left-sided
biventricular pacemaker/AICD in position with lead tips projecting
over the expected location of the right ventricular apex, right
atrium, and overlying the lateral ventricle via the coronary sinus
and coronary veins.
IMPRESSION: 1. Low lung volumes without radiographic evidence of acute
cardiopulmonary disease.
2. Mild cardiomegaly.
3. Atherosclerosis.
4. Postoperative changes and support apparatus, as above.
